# Patient Record
Sex: Male | Born: 1945 | Race: White | Hispanic: No | Marital: Married | Smoking: Never smoker
Health system: Southern US, Community
[De-identification: ages and names within clinical notes are randomized; demographics above are authoritative.]

## PROBLEM LIST (undated history)

## (undated) DIAGNOSIS — N529 Male erectile dysfunction, unspecified: Secondary | ICD-10-CM

## (undated) DIAGNOSIS — J189 Pneumonia, unspecified organism: Secondary | ICD-10-CM

## (undated) DIAGNOSIS — M779 Enthesopathy, unspecified: Secondary | ICD-10-CM

## (undated) DIAGNOSIS — M1990 Unspecified osteoarthritis, unspecified site: Secondary | ICD-10-CM

## (undated) DIAGNOSIS — T8859XA Other complications of anesthesia, initial encounter: Secondary | ICD-10-CM

## (undated) DIAGNOSIS — T4145XA Adverse effect of unspecified anesthetic, initial encounter: Secondary | ICD-10-CM

## (undated) DIAGNOSIS — J302 Other seasonal allergic rhinitis: Secondary | ICD-10-CM

## (undated) DIAGNOSIS — C801 Malignant (primary) neoplasm, unspecified: Secondary | ICD-10-CM

## (undated) DIAGNOSIS — K219 Gastro-esophageal reflux disease without esophagitis: Secondary | ICD-10-CM

## (undated) DIAGNOSIS — G4730 Sleep apnea, unspecified: Secondary | ICD-10-CM

## (undated) DIAGNOSIS — H9319 Tinnitus, unspecified ear: Secondary | ICD-10-CM

## (undated) HISTORY — PX: PROSTATE BIOPSY: SHX241

## (undated) HISTORY — PX: WISDOM TOOTH EXTRACTION: SHX21

## (undated) HISTORY — PX: KNEE ARTHROSCOPY: SHX127

## (undated) HISTORY — PX: ROTATOR CUFF REPAIR: SHX139

## (undated) HISTORY — PX: HIP SURGERY: SHX245

---

## 1972-12-05 HISTORY — PX: VASECTOMY: SHX75

## 2012-11-19 ENCOUNTER — Encounter: Payer: Self-pay | Admitting: Gastroenterology

## 2012-11-19 HISTORY — PX: COLONOSCOPY: SHX174

## 2012-11-19 HISTORY — PX: ESOPHAGOGASTRODUODENOSCOPY: SHX1529

## 2012-11-19 HISTORY — PX: UPPER GI ENDOSCOPY: SHX6162

## 2014-12-02 ENCOUNTER — Other Ambulatory Visit (HOSPITAL_COMMUNITY): Payer: Self-pay | Admitting: Urology

## 2014-12-02 DIAGNOSIS — C61 Malignant neoplasm of prostate: Secondary | ICD-10-CM

## 2014-12-18 ENCOUNTER — Ambulatory Visit (HOSPITAL_COMMUNITY)
Admission: RE | Admit: 2014-12-18 | Discharge: 2014-12-18 | Disposition: A | Discharge status: Home / Self Care | Payer: Medicare HMO | Source: Ambulatory Visit | Attending: Urology | Admitting: Urology

## 2014-12-18 DIAGNOSIS — C61 Malignant neoplasm of prostate: Secondary | ICD-10-CM | POA: Diagnosis not present

## 2014-12-18 LAB — POCT I-STAT CREATININE: Creatinine, Ser: 1.1 mg/dL (ref 0.50–1.35)

## 2014-12-18 MED ORDER — GADOBENATE DIMEGLUMINE 529 MG/ML IV SOLN
17.0000 mL | Freq: Once | INTRAVENOUS | Status: AC | PRN
  Administered 2014-12-18: 17 mL via INTRAVENOUS

## 2015-02-10 DIAGNOSIS — C61 Malignant neoplasm of prostate: Secondary | ICD-10-CM | POA: Diagnosis not present

## 2015-02-24 DIAGNOSIS — C61 Malignant neoplasm of prostate: Secondary | ICD-10-CM | POA: Diagnosis not present

## 2015-02-25 ENCOUNTER — Other Ambulatory Visit: Payer: Self-pay | Admitting: Urology

## 2015-03-03 DIAGNOSIS — R278 Other lack of coordination: Secondary | ICD-10-CM | POA: Diagnosis not present

## 2015-03-03 DIAGNOSIS — C61 Malignant neoplasm of prostate: Secondary | ICD-10-CM | POA: Diagnosis not present

## 2015-03-03 DIAGNOSIS — M6281 Muscle weakness (generalized): Secondary | ICD-10-CM | POA: Diagnosis not present

## 2015-03-06 DIAGNOSIS — Z0000 Encounter for general adult medical examination without abnormal findings: Secondary | ICD-10-CM | POA: Diagnosis not present

## 2015-03-06 DIAGNOSIS — J301 Allergic rhinitis due to pollen: Secondary | ICD-10-CM | POA: Diagnosis not present

## 2015-03-06 DIAGNOSIS — Z79899 Other long term (current) drug therapy: Secondary | ICD-10-CM | POA: Diagnosis not present

## 2015-03-06 DIAGNOSIS — C61 Malignant neoplasm of prostate: Secondary | ICD-10-CM | POA: Diagnosis not present

## 2015-03-06 DIAGNOSIS — E780 Pure hypercholesterolemia: Secondary | ICD-10-CM | POA: Diagnosis not present

## 2015-03-06 DIAGNOSIS — Z1389 Encounter for screening for other disorder: Secondary | ICD-10-CM | POA: Diagnosis not present

## 2015-03-10 DIAGNOSIS — R278 Other lack of coordination: Secondary | ICD-10-CM | POA: Diagnosis not present

## 2015-03-10 DIAGNOSIS — M6281 Muscle weakness (generalized): Secondary | ICD-10-CM | POA: Diagnosis not present

## 2015-03-10 DIAGNOSIS — C61 Malignant neoplasm of prostate: Secondary | ICD-10-CM | POA: Diagnosis not present

## 2015-03-24 DIAGNOSIS — C61 Malignant neoplasm of prostate: Secondary | ICD-10-CM | POA: Diagnosis not present

## 2015-03-24 DIAGNOSIS — M6281 Muscle weakness (generalized): Secondary | ICD-10-CM | POA: Diagnosis not present

## 2015-03-24 DIAGNOSIS — R278 Other lack of coordination: Secondary | ICD-10-CM | POA: Diagnosis not present

## 2015-04-06 NOTE — Patient Instructions (Addendum)
[place]  04/06/2015   Your procedure is scheduled on:   04-20-2015 Monday  Enter through [facility]  Entrance and follow signs to [facility]. Arrive at   [place]     AM.  Call this number if you have problems the morning of surgery: [phone]  Or Presurgical Testing [phone].   For Living Will and/or Health Care Power Attorney Forms: please provide copy for your medical record,may bring AM of surgery(Forms should be already notarized -we do not provide this service).  Remember: Follow any bowel prep instructions per MD office.    Do not eat food/ or drink: After Midnight.      Take these medicines the morning of surgery with A SIP OF WATER: nexium, singulair, eye drops and nose spray if needed   Do not wear jewelry, make-up or nail polish.  Do not wear deodorant, lotions, powders, or perfumes.   Do not shave legs and under arms- 48 hours(2 days) prior to first CHG shower.(Shaving face and neck okay.)  Do not bring valuables to the hospital.(Hospital is not responsible for lost valuables).  Contacts, dentures or removable bridgework, body piercing, hair pins may not be worn into surgery.  Leave suitcase in the car. After surgery it may be brought to your room.  For patients admitted to the hospital, checkout time is 11:00 AM the day of discharge.(Restricted visitors-Any Persons displaying flu-like symptoms or illness).        [place] - Preparing for Surgery Before surgery, you can play an important role.  Because skin is not sterile, your skin needs to be as free of germs as possible.  You can reduce the number of germs on your skin by washing with CHG (chlorahexidine gluconate) soap before surgery.  CHG is an antiseptic cleaner which kills germs and bonds with the skin to continue killing germs even after washing. Please DO NOT use if you have an allergy to CHG or antibacterial soaps.  If your skin becomes reddened/irritated stop using the CHG and inform your  nurse when you arrive at Short Stay. Do not shave (including legs and underarms) for at least 48 hours prior to the first CHG shower.  You may shave your face/neck. Please follow these instructions carefully:  1.  Shower with CHG Soap the night before surgery and the  morning of Surgery.  2.  If you choose to wash your hair, wash your hair first as usual with your  normal  shampoo.  3.  After you shampoo, rinse your hair and body thoroughly to remove the  shampoo.                            4.  Use CHG as you would any other liquid soap.  You can apply chg directly  to the skin and wash                       Gently with a scrungie or clean washcloth.  5.  Apply the CHG Soap to your body ONLY FROM THE NECK DOWN.   Do not use on face/ open                           Wound or open sores. Avoid contact with eyes, ears mouth and genitals (private parts).  Wash face,  Genitals (private parts) with your normal soap.             6.  Wash thoroughly, paying special attention to the area where your surgery  will be performed.  7.  Thoroughly rinse your body with warm water from the neck down.  8.  DO NOT shower/wash with your normal soap after using and rinsing off  the CHG Soap.                9.  Pat yourself dry with a clean towel.            10.  Wear clean pajamas.            11.  Place clean sheets on your bed the night of your first shower and do not  sleep with pets. Day of Surgery : Do not apply any lotions/deodorants the morning of surgery.  Please wear clean clothes to the hospital/surgery center.  FAILURE TO FOLLOW THESE INSTRUCTIONS MAY RESULT IN THE CANCELLATION OF YOUR SURGERY PATIENT SIGNATURE_________________________________  NURSE SIGNATURE__________________________________  ________________________________________________________________________   Benjamin Rivas  An incentive spirometer is a tool that can help keep your lungs clear and active. This  tool measures how well you are filling your lungs with each breath. Taking long deep breaths may help reverse or decrease the chance of developing breathing (pulmonary) problems (especially infection) following:  A long period of time when you are unable to move or be active. BEFORE THE PROCEDURE   If the spirometer includes an indicator to show your best effort, your nurse or respiratory therapist will set it to a desired goal.  If possible, sit up straight or lean slightly forward. Try not to slouch.  Hold the incentive spirometer in an upright position. INSTRUCTIONS FOR USE   Sit on the edge of your bed if possible, or sit up as far as you can in bed or on a chair.  Hold the incentive spirometer in an upright position.  Breathe out normally.  Place the mouthpiece in your mouth and seal your lips tightly around it.  Breathe in slowly and as deeply as possible, raising the piston or the ball toward the top of the column.  Hold your breath for 3-5 seconds or for as long as possible. Allow the piston or ball to fall to the bottom of the column.  Remove the mouthpiece from your mouth and breathe out normally.  Rest for a few seconds and repeat Steps 1 through 7 at least 10 times every 1-2 hours when you are awake. Take your time and take a few normal breaths between deep breaths.  The spirometer may include an indicator to show your best effort. Use the indicator as a goal to work toward during each repetition.  After each set of 10 deep breaths, practice coughing to be sure your lungs are clear. If you have an incision (the cut made at the time of surgery), support your incision when coughing by placing a pillow or rolled up towels firmly against it. Once you are able to get out of bed, walk around indoors and cough well. You may stop using the incentive spirometer when instructed by your caregiver.  RISKS AND COMPLICATIONS  Take your time so you do not get dizzy or  light-headed.  If you are in pain, you may need to take or ask for pain medication before doing incentive spirometry. It is harder to take a deep breath if you are having  pain. AFTER USE  Rest and breathe slowly and easily.  It can be helpful to keep track of a log of your progress. Your caregiver can provide you with a simple table to help with this. If you are using the spirometer at home, follow these instructions: [place] IF:   You are having difficultly using the spirometer.  You have trouble using the spirometer as often as instructed.  Your pain medication is not giving enough relief while using the spirometer.  You develop fever of 100.5 F (38.1 C) or higher. SEEK IMMEDIATE MEDICAL CARE IF:   You cough up bloody sputum that had not been present before.  You develop fever of 102 F (38.9 C) or greater.  You develop worsening pain at or near the incision site. MAKE SURE YOU:   Understand these instructions.  Will watch your condition.  Will get help right away if you are not doing well or get worse. Document Released: 04/03/2007 Document Revised: 02/13/2012 Document Reviewed: 06/04/2007 ExitCare Patient Information 2014 ExitCare, [place].   ________________________________________________________________________  WHAT IS A BLOOD TRANSFUSION? Blood Transfusion Information  A transfusion is the replacement of blood or some of its parts. Blood is made up of multiple cells which provide different functions.  Red blood cells carry oxygen and are used for blood loss replacement.  White blood cells fight against infection.  Platelets control bleeding.  Plasma helps clot blood.  Other blood products are available for specialized needs, such as hemophilia or other clotting disorders. BEFORE THE TRANSFUSION  Who gives blood for transfusions?   Healthy volunteers who are fully evaluated to make sure their blood is safe. This is blood bank  blood. Transfusion therapy is the safest it has ever been in the practice of medicine. Before blood is taken from a donor, a complete history is taken to make sure that person has no history of diseases nor engages in risky social behavior (examples are intravenous drug use or sexual activity with multiple partners). The donor's travel history is screened to minimize risk of transmitting infections, such as malaria. The donated blood is tested for signs of infectious diseases, such as HIV and hepatitis. The blood is then tested to be sure it is compatible with you in order to minimize the chance of a transfusion reaction. If you or a relative donates blood, this is often done in anticipation of surgery and is not appropriate for emergency situations. It takes many days to process the donated blood. RISKS AND COMPLICATIONS Although transfusion therapy is very safe and saves many lives, the main dangers of transfusion include:   Getting an infectious disease.  Developing a transfusion reaction. This is an allergic reaction to something in the blood you were given. Every precaution is taken to prevent this. The decision to have a blood transfusion has been considered carefully by your caregiver before blood is given. Blood is not given unless the benefits outweigh the risks. AFTER THE TRANSFUSION  Right after receiving a blood transfusion, you will usually feel much better and more energetic. This is especially true if your red blood cells have gotten low (anemic). The transfusion raises the level of the red blood cells which carry oxygen, and this usually causes an energy increase.  The nurse administering the transfusion will monitor you carefully for complications. HOME CARE INSTRUCTIONS  No special instructions are needed after a transfusion. You may find your energy is better. Speak with your caregiver about any limitations on activity for underlying diseases  you may have. SEEK MEDICAL CARE IF:    Your condition is not improving after your transfusion.  You develop redness or irritation at the intravenous (IV) site. SEEK IMMEDIATE MEDICAL CARE IF:  Any of the following symptoms occur over the next 12 hours:  Shaking chills.  You have a temperature by mouth above 102 F (38.9 C), not controlled by medicine.  Chest, back, or muscle pain.  People around you feel you are not acting correctly or are confused.  Shortness of breath or difficulty breathing.  Dizziness and fainting.  You get a rash or develop hives.  You have a decrease in urine output.  Your urine turns a dark color or changes to pink, red, or brown. Any of the following symptoms occur over the next 10 days:  You have a temperature by mouth above 102 F (38.9 C), not controlled by medicine.  Shortness of breath.  Weakness after normal activity.  The white part of the eye turns yellow (jaundice).  You have a decrease in the amount of urine or are urinating less often.  Your urine turns a dark color or changes to pink, red, or brown. Document Released: 11/18/2000 Document Revised: 02/13/2012 Document Reviewed: 07/07/2008 [facility] Patient Information 2014 [place], [place].  _______________________________________________________________________

## 2015-04-07 ENCOUNTER — Encounter (HOSPITAL_COMMUNITY): Payer: Self-pay

## 2015-04-07 ENCOUNTER — Other Ambulatory Visit: Payer: Self-pay

## 2015-04-07 ENCOUNTER — Encounter (HOSPITAL_COMMUNITY)
Admission: RE | Admit: 2015-04-07 | Discharge: 2015-04-07 | Disposition: A | Discharge status: Home / Self Care | Payer: Commercial Managed Care - HMO | Source: Ambulatory Visit | Attending: Urology | Admitting: Urology

## 2015-04-07 ENCOUNTER — Ambulatory Visit (HOSPITAL_COMMUNITY)
Admission: RE | Admit: 2015-04-07 | Discharge: 2015-04-07 | Disposition: A | Discharge status: Home / Self Care | Payer: Commercial Managed Care - HMO | Source: Ambulatory Visit | Attending: Anesthesiology | Admitting: Anesthesiology

## 2015-04-07 DIAGNOSIS — R0602 Shortness of breath: Secondary | ICD-10-CM | POA: Diagnosis not present

## 2015-04-07 DIAGNOSIS — C61 Malignant neoplasm of prostate: Secondary | ICD-10-CM | POA: Insufficient documentation

## 2015-04-07 DIAGNOSIS — R278 Other lack of coordination: Secondary | ICD-10-CM | POA: Diagnosis not present

## 2015-04-07 DIAGNOSIS — Z01810 Encounter for preprocedural cardiovascular examination: Secondary | ICD-10-CM | POA: Diagnosis not present

## 2015-04-07 DIAGNOSIS — M6281 Muscle weakness (generalized): Secondary | ICD-10-CM | POA: Diagnosis not present

## 2015-04-07 DIAGNOSIS — Z01811 Encounter for preprocedural respiratory examination: Secondary | ICD-10-CM | POA: Diagnosis not present

## 2015-04-07 DIAGNOSIS — C801 Malignant (primary) neoplasm, unspecified: Secondary | ICD-10-CM

## 2015-04-07 HISTORY — DX: Tinnitus, unspecified ear: H93.19

## 2015-04-07 HISTORY — DX: Enthesopathy, unspecified: M77.9

## 2015-04-07 HISTORY — DX: Other seasonal allergic rhinitis: J30.2

## 2015-04-07 HISTORY — DX: Unspecified osteoarthritis, unspecified site: M19.90

## 2015-04-07 HISTORY — DX: Malignant (primary) neoplasm, unspecified: C80.1

## 2015-04-07 HISTORY — DX: Adverse effect of unspecified anesthetic, initial encounter: T41.45XA

## 2015-04-07 HISTORY — DX: Other complications of anesthesia, initial encounter: T88.59XA

## 2015-04-07 HISTORY — DX: Gastro-esophageal reflux disease without esophagitis: K21.9

## 2015-04-07 LAB — BASIC METABOLIC PANEL
ANION GAP: 11 (ref 5–15)
BUN: 13 mg/dL (ref 6–20)
CALCIUM: 9.5 mg/dL (ref 8.9–10.3)
CHLORIDE: 103 mmol/L (ref 101–111)
CO2: 26 mmol/L (ref 22–32)
Creatinine, Ser: 1.21 mg/dL (ref 0.61–1.24)
GFR calc Af Amer: >60 mL/min (ref >60)
GFR calc non Af Amer: >60 mL/min (ref >60)
Glucose, Bld: 141 mg/dL — ABNORMAL HIGH (ref 70–99)
Potassium: 3.9 mmol/L (ref 3.5–5.1)
Sodium: 140 mmol/L (ref 135–145)

## 2015-04-07 LAB — CBC
HCT: 47.9 % (ref 39.0–52.0)
Hemoglobin: 15.3 g/dL (ref 13.0–17.0)
MCH: 30.1 pg (ref 26.0–34.0)
MCHC: 31.9 g/dL (ref 30.0–36.0)
MCV: 94.1 fL (ref 78.0–100.0)
PLATELETS: 260 10*3/uL (ref 150–400)
RBC: 5.09 MIL/uL (ref 4.22–5.81)
RDW: 13 % (ref 11.5–15.5)
WBC: 8.6 10*3/uL (ref 4.0–10.5)

## 2015-04-09 DIAGNOSIS — J0120 Acute ethmoidal sinusitis, unspecified: Secondary | ICD-10-CM | POA: Diagnosis not present

## 2015-04-17 NOTE — H&P (Signed)
  History of Present Illness [name] is a 69 year old gentleman with a fluctuating PSA who eventually underwent a prostate biopsy on 07/18/14 after his PSA increased to 5.12. 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 He has no known family history of prostate cancer. He is in generally good health overall. After initial consultation, he elected to proceed with active surveillance for management.  Initial diagnosis: August 2015 TNM stage: cT1c Nx Mx PSA: 5.12 Gleason score: 3+3=6 Biopsy (07/18/14): 2/12 cores positive -- L base (5%), R lateral apex (10%) Prostate volume: 61.1 cc PSAD: 0.08  Surveillance Jan 2016: MRI - 8 x 10 mm lesions with restricted diffusion at the right base near SV Mar 2016: 28 core biopsy (cognitive fusion, with extended sampling including transition zone) - 3/28 cores positive    L base (2/2 cores, 5% and 5%, 3+3=6), L lateral base (10%, 3+4=7), Vol 56.7 cc  Urinary function: He has minimal lower urinary tract symptoms. IPSS is 0. Erectile function: He does have erectile dysfunction and has a very low libido. He has not previously undergone treatment. He has significant difficulties obtaining an erection.     Past Medical History Problems  1. History of Arthritis 2. History of esophageal reflux (Z87.19)  Surgical History Problems  1. History of Knee Surgery Left 2. History of Rotator Cuff Repair  Current Meds 1. Aspirin 81 MG Oral Tablet;  Therapy: (Recorded:02Jan2013) to Recorded 2. Centrum Oral Tablet;  Therapy: (Recorded:26Jun2015) to Recorded 3. PriLOSEC 20 MG Oral Capsule Delayed Release;  Therapy: (Recorded:02Jan2013) to Recorded 4. Singulair 10 MG Oral Tablet;  Therapy: (Recorded:26Jun2015) to Recorded 5. Zyrtec 10 MG TABS;  Therapy: (Recorded:26Jun2015) to Recorded  Allergies Medication  1. No Known Drug Allergies  Family History Problems  1. No pertinent family history : Mother 2. Denied: Family  history of Prostate Cancer  Social History Problems  1. Denied: History of Alcohol Use 2. Marital History - Currently Married 3. Never A Smoker 4. Retired From Work  Physical Exam Constitutional: Well nourished and well developed . No acute distress.  ENT:. The ears and nose are normal in appearance.  Neck: The appearance of the neck is normal and no neck mass is present.  Pulmonary: No respiratory distress, normal respiratory rhythm and effort and clear bilateral breath sounds.  Cardiovascular: Heart rate and rhythm are normal . No peripheral edema.  Abdomen: The abdomen is soft and nontender. No masses are palpated. No CVA tenderness. No hernias are palpable. No hepatosplenomegaly noted.  Genitourinary: Examination of the penis demonstrates no discharge, no masses, no lesions and a normal meatus. The scrotum is without lesions. The right epididymis is palpably normal and non-tender. The left epididymis is palpably normal and non-tender. The right testis is non-tender and without masses. The left testis is non-tender and without masses.  Lymphatics: The femoral and inguinal nodes are not enlarged or tender.  Skin: Normal skin turgor, no visible rash and no visible skin lesions.  Neuro/Psych:. Mood and affect are appropriate.    Assessment Assessed  1. Prostate cancer (C61)    Discussion/Summary 1. Prostate cancer: He has elected surgical therapy and will proceed with a robot assisted laparoscopic radical prostatectomy and BPLND.

## 2015-04-18 MED ORDER — HYDROMORPHONE HCL 1 MG/ML IJ SOLN
INTRAMUSCULAR | Status: AC
  Filled 2015-04-18: qty 1

## 2015-04-19 NOTE — Anesthesia Preprocedure Evaluation (Addendum)
Anesthesia Evaluation  Patient identified by MRN, date of birth, ID band Patient awake    Reviewed: Allergy & Precautions, H&P , NPO status , Patient's Chart, lab work & pertinent test results  Airway Mallampati: II  TM Distance: >3 FB Neck ROM: full    Dental  (+) Caps, Dental Advisory Given Lower front are capped:   Pulmonary neg pulmonary ROS,  breath sounds clear to auscultation  Pulmonary exam normal       Cardiovascular Exercise Tolerance: Good negative cardio ROS Normal cardiovascular examRhythm:regular Rate:Normal     Neuro/Psych negative neurological ROS  negative psych ROS   GI/Hepatic negative GI ROS, Neg liver ROS, GERD-  Medicated and Controlled,  Endo/Other  negative endocrine ROS  Renal/GU negative Renal ROS  negative genitourinary   Musculoskeletal   Abdominal   Peds  Hematology negative hematology ROS (+)   Anesthesia Other Findings   Reproductive/Obstetrics negative OB ROS                            Anesthesia Physical Anesthesia Plan  ASA: II  Anesthesia Plan: General   Post-op Pain Management:    Induction: Intravenous  Airway Management Planned: Oral ETT  Additional Equipment:   Intra-op Plan:   Post-operative Plan: Extubation in OR  Informed Consent: I have reviewed the patients History and Physical, chart, labs and discussed the procedure including the risks, benefits and alternatives for the proposed anesthesia with the patient or authorized representative who has indicated his/her understanding and acceptance.   Dental Advisory Given  Plan Discussed with: CRNA and Surgeon  Anesthesia Plan Comments:         Anesthesia Quick Evaluation

## 2015-04-20 ENCOUNTER — Inpatient Hospital Stay (HOSPITAL_COMMUNITY)
Admission: RE | Admit: 2015-04-20 | Discharge: 2015-04-21 | DRG: 708 | Disposition: A | Discharge status: Home / Self Care | Payer: Commercial Managed Care - HMO | Source: Ambulatory Visit | Attending: Urology | Admitting: Urology

## 2015-04-20 ENCOUNTER — Encounter (HOSPITAL_COMMUNITY): Payer: Self-pay | Admitting: *Deleted

## 2015-04-20 ENCOUNTER — Inpatient Hospital Stay (HOSPITAL_COMMUNITY): Payer: Commercial Managed Care - HMO | Admitting: Anesthesiology

## 2015-04-20 ENCOUNTER — Encounter (HOSPITAL_COMMUNITY)
Admission: RE | Disposition: A | Discharge status: Home / Self Care | Payer: Self-pay | Source: Ambulatory Visit | Attending: Urology

## 2015-04-20 DIAGNOSIS — C61 Malignant neoplasm of prostate: Principal | ICD-10-CM | POA: Diagnosis present

## 2015-04-20 DIAGNOSIS — K219 Gastro-esophageal reflux disease without esophagitis: Secondary | ICD-10-CM | POA: Diagnosis not present

## 2015-04-20 DIAGNOSIS — Z7982 Long term (current) use of aspirin: Secondary | ICD-10-CM

## 2015-04-20 DIAGNOSIS — N529 Male erectile dysfunction, unspecified: Secondary | ICD-10-CM | POA: Diagnosis present

## 2015-04-20 DIAGNOSIS — M1990 Unspecified osteoarthritis, unspecified site: Secondary | ICD-10-CM | POA: Diagnosis present

## 2015-04-20 HISTORY — PX: LYMPHADENECTOMY: SHX5960

## 2015-04-20 HISTORY — PX: ROBOT ASSISTED LAPAROSCOPIC RADICAL PROSTATECTOMY: SHX5141

## 2015-04-20 LAB — HEMOGLOBIN AND HEMATOCRIT, BLOOD
HCT: 44.7 % (ref 39.0–52.0)
HEMOGLOBIN: 14.6 g/dL (ref 13.0–17.0)

## 2015-04-20 LAB — TYPE AND SCREEN
ABO/RH(D): A POS
ANTIBODY SCREEN: NEGATIVE

## 2015-04-20 LAB — ABO/RH: ABO/RH(D): A POS

## 2015-04-20 SURGERY — ROBOTIC ASSISTED LAPAROSCOPIC RADICAL PROSTATECTOMY LEVEL 2
Anesthesia: General

## 2015-04-20 MED ORDER — ROCURONIUM BROMIDE 100 MG/10ML IV SOLN
INTRAVENOUS | Status: AC
  Filled 2015-04-20: qty 1

## 2015-04-20 MED ORDER — FENTANYL CITRATE (PF) 250 MCG/5ML IJ SOLN
INTRAMUSCULAR | Status: AC
  Filled 2015-04-20: qty 5

## 2015-04-20 MED ORDER — FENTANYL CITRATE (PF) 100 MCG/2ML IJ SOLN
INTRAMUSCULAR | Status: DC | PRN
  Administered 2015-04-20: 75 ug via INTRAVENOUS
  Administered 2015-04-20: 25 ug via INTRAVENOUS
  Administered 2015-04-20: 50 ug via INTRAVENOUS
  Administered 2015-04-20: 100 ug via INTRAVENOUS

## 2015-04-20 MED ORDER — LORATADINE 10 MG PO TABS
10.0000 mg | ORAL_TABLET | Freq: Every day | ORAL | Status: DC
  Administered 2015-04-21: 10 mg via ORAL
  Filled 2015-04-20 (×2): qty 1

## 2015-04-20 MED ORDER — LIDOCAINE HCL (CARDIAC) 20 MG/ML IV SOLN
INTRAVENOUS | Status: AC
  Filled 2015-04-20: qty 5

## 2015-04-20 MED ORDER — HEPARIN SODIUM (PORCINE) 1000 UNIT/ML IJ SOLN
INTRAMUSCULAR | Status: AC
  Filled 2015-04-20: qty 1

## 2015-04-20 MED ORDER — MORPHINE SULFATE 2 MG/ML IJ SOLN: 2.0000 mg | INTRAMUSCULAR | Status: DC | PRN

## 2015-04-20 MED ORDER — GLYCOPYRROLATE 0.2 MG/ML IJ SOLN
INTRAMUSCULAR | Status: AC
  Filled 2015-04-20: qty 3

## 2015-04-20 MED ORDER — KCL IN DEXTROSE-NACL 20-5-0.45 MEQ/L-%-% IV SOLN
INTRAVENOUS | Status: AC
  Filled 2015-04-20: qty 1000

## 2015-04-20 MED ORDER — BUPIVACAINE-EPINEPHRINE 0.25% -1:200000 IJ SOLN
INTRAMUSCULAR | Status: DC | PRN
  Administered 2015-04-20: 30 mL

## 2015-04-20 MED ORDER — ACETAMINOPHEN 325 MG PO TABS: 650.0000 mg | ORAL_TABLET | ORAL | Status: DC | PRN

## 2015-04-20 MED ORDER — DIPHENHYDRAMINE HCL 12.5 MG/5ML PO ELIX: 12.5000 mg | ORAL_SOLUTION | Freq: Four times a day (QID) | ORAL | Status: DC | PRN

## 2015-04-20 MED ORDER — DIPHENHYDRAMINE HCL 50 MG/ML IJ SOLN: 12.5000 mg | Freq: Four times a day (QID) | INTRAMUSCULAR | Status: DC | PRN

## 2015-04-20 MED ORDER — ONDANSETRON HCL 4 MG/2ML IJ SOLN
INTRAMUSCULAR | Status: AC
  Filled 2015-04-20: qty 2

## 2015-04-20 MED ORDER — HYDROMORPHONE HCL 1 MG/ML IJ SOLN
0.2500 mg | INTRAMUSCULAR | Status: DC | PRN
  Administered 2015-04-20 (×2): 0.5 mg via INTRAVENOUS

## 2015-04-20 MED ORDER — CEFAZOLIN SODIUM 1-5 GM-% IV SOLN
1.0000 g | Freq: Three times a day (TID) | INTRAVENOUS | Status: AC
  Administered 2015-04-20 (×2): 1 g via INTRAVENOUS
  Filled 2015-04-20 (×2): qty 50

## 2015-04-20 MED ORDER — PHENYLEPHRINE HCL 10 MG/ML IJ SOLN
INTRAMUSCULAR | Status: DC | PRN
  Administered 2015-04-20 (×2): 80 ug via INTRAVENOUS
  Administered 2015-04-20 (×2): 40 ug via INTRAVENOUS
  Administered 2015-04-20 (×3): 80 ug via INTRAVENOUS

## 2015-04-20 MED ORDER — GLYCOPYRROLATE 0.2 MG/ML IJ SOLN: INTRAMUSCULAR | Status: DC | PRN

## 2015-04-20 MED ORDER — EPHEDRINE SULFATE 50 MG/ML IJ SOLN
INTRAMUSCULAR | Status: DC | PRN
  Administered 2015-04-20: 10 mg via INTRAVENOUS

## 2015-04-20 MED ORDER — HYDROMORPHONE HCL 1 MG/ML IJ SOLN
INTRAMUSCULAR | Status: AC
  Filled 2015-04-20: qty 1

## 2015-04-20 MED ORDER — PHENYLEPHRINE 40 MCG/ML (10ML) SYRINGE FOR IV PUSH (FOR BLOOD PRESSURE SUPPORT)
PREFILLED_SYRINGE | INTRAVENOUS | Status: AC
  Filled 2015-04-20: qty 10

## 2015-04-20 MED ORDER — PHENYLEPHRINE 40 MCG/ML (10ML) SYRINGE FOR IV PUSH (FOR BLOOD PRESSURE SUPPORT)
PREFILLED_SYRINGE | INTRAVENOUS | Status: AC
  Filled 2015-04-20: qty 20

## 2015-04-20 MED ORDER — PANTOPRAZOLE SODIUM 40 MG PO TBEC
40.0000 mg | DELAYED_RELEASE_TABLET | Freq: Every day | ORAL | Status: DC
  Administered 2015-04-20 – 2015-04-21 (×2): 40 mg via ORAL
  Filled 2015-04-20 (×2): qty 1

## 2015-04-20 MED ORDER — CIPROFLOXACIN HCL 500 MG PO TABS: 500.0000 mg | ORAL_TABLET | Freq: Two times a day (BID) | ORAL | Status: DC

## 2015-04-20 MED ORDER — NEOSTIGMINE METHYLSULFATE 10 MG/10ML IV SOLN
INTRAVENOUS | Status: AC
  Filled 2015-04-20: qty 1

## 2015-04-20 MED ORDER — MIDAZOLAM HCL 5 MG/5ML IJ SOLN
INTRAMUSCULAR | Status: DC | PRN
  Administered 2015-04-20 (×2): 1 mg via INTRAVENOUS

## 2015-04-20 MED ORDER — PROPOFOL 10 MG/ML IV BOLUS
INTRAVENOUS | Status: AC
  Filled 2015-04-20: qty 20

## 2015-04-20 MED ORDER — KCL IN DEXTROSE-NACL 20-5-0.45 MEQ/L-%-% IV SOLN
INTRAVENOUS | Status: DC
  Administered 2015-04-20: 1000 mL via INTRAVENOUS
  Administered 2015-04-20 – 2015-04-21 (×2): via INTRAVENOUS
  Filled 2015-04-20 (×4): qty 1000

## 2015-04-20 MED ORDER — FLUTICASONE PROPIONATE 50 MCG/ACT NA SUSP
1.0000 | Freq: Every day | NASAL | Status: DC
  Administered 2015-04-21: 1 via NASAL
  Filled 2015-04-20: qty 16

## 2015-04-20 MED ORDER — ONDANSETRON HCL 4 MG/2ML IJ SOLN
4.0000 mg | INTRAMUSCULAR | Status: DC | PRN
  Administered 2015-04-20: 4 mg via INTRAVENOUS
  Filled 2015-04-20: qty 2

## 2015-04-20 MED ORDER — SODIUM CHLORIDE 0.9 % IR SOLN
Status: DC | PRN
  Administered 2015-04-20: 300 mL via INTRAVESICAL
  Administered 2015-04-20: 200 mL

## 2015-04-20 MED ORDER — LACTATED RINGERS IV SOLN
INTRAVENOUS | Status: DC | PRN
  Administered 2015-04-20 (×2): via INTRAVENOUS

## 2015-04-20 MED ORDER — PROPOFOL 10 MG/ML IV BOLUS
INTRAVENOUS | Status: DC | PRN
Start: 2015-04-20 — End: 2015-04-20
  Administered 2015-04-20: 20 mg via INTRAVENOUS
  Administered 2015-04-20: 180 mg via INTRAVENOUS

## 2015-04-20 MED ORDER — MIDAZOLAM HCL 2 MG/2ML IJ SOLN
INTRAMUSCULAR | Status: AC
  Filled 2015-04-20: qty 2

## 2015-04-20 MED ORDER — EPHEDRINE SULFATE 50 MG/ML IJ SOLN
INTRAMUSCULAR | Status: AC
  Filled 2015-04-20: qty 1

## 2015-04-20 MED ORDER — KETOROLAC TROMETHAMINE 15 MG/ML IJ SOLN
15.0000 mg | Freq: Four times a day (QID) | INTRAMUSCULAR | Status: DC
  Administered 2015-04-20 – 2015-04-21 (×5): 15 mg via INTRAVENOUS
  Filled 2015-04-20 (×5): qty 1

## 2015-04-20 MED ORDER — SUCCINYLCHOLINE CHLORIDE 20 MG/ML IJ SOLN
INTRAMUSCULAR | Status: DC | PRN
  Administered 2015-04-20: 100 mg via INTRAVENOUS

## 2015-04-20 MED ORDER — SODIUM CHLORIDE 0.9 % IJ SOLN
INTRAMUSCULAR | Status: AC
  Filled 2015-04-20: qty 10

## 2015-04-20 MED ORDER — LIDOCAINE HCL (CARDIAC) 20 MG/ML IV SOLN
INTRAVENOUS | Status: DC | PRN
  Administered 2015-04-20: 80 mg via INTRAVENOUS

## 2015-04-20 MED ORDER — STERILE WATER FOR IRRIGATION IR SOLN
Status: DC | PRN
  Administered 2015-04-20: 1500 mL

## 2015-04-20 MED ORDER — ONDANSETRON HCL 4 MG/2ML IJ SOLN
INTRAMUSCULAR | Status: DC | PRN
  Administered 2015-04-20: 4 mg via INTRAVENOUS

## 2015-04-20 MED ORDER — LACTATED RINGERS IV SOLN: INTRAVENOUS | Status: DC

## 2015-04-20 MED ORDER — SODIUM CHLORIDE 0.9 % IV BOLUS (SEPSIS)
1000.0000 mL | Freq: Once | INTRAVENOUS | Status: AC
  Administered 2015-04-20: 1000 mL via INTRAVENOUS

## 2015-04-20 MED ORDER — GLYCOPYRROLATE 0.2 MG/ML IJ SOLN
INTRAMUSCULAR | Status: DC | PRN
  Administered 2015-04-20: .6 mg via INTRAVENOUS

## 2015-04-20 MED ORDER — DOCUSATE SODIUM 100 MG PO CAPS
100.0000 mg | ORAL_CAPSULE | Freq: Two times a day (BID) | ORAL | Status: DC
  Administered 2015-04-20 – 2015-04-21 (×3): 100 mg via ORAL
  Filled 2015-04-20 (×4): qty 1

## 2015-04-20 MED ORDER — CEFAZOLIN SODIUM-DEXTROSE 2-3 GM-% IV SOLR
INTRAVENOUS | Status: AC
  Filled 2015-04-20: qty 50

## 2015-04-20 MED ORDER — CEFAZOLIN SODIUM-DEXTROSE 2-3 GM-% IV SOLR
2.0000 g | INTRAVENOUS | Status: AC
  Administered 2015-04-20: 2 g via INTRAVENOUS

## 2015-04-20 MED ORDER — ROCURONIUM BROMIDE 100 MG/10ML IV SOLN
INTRAVENOUS | Status: DC | PRN
  Administered 2015-04-20: 30 mg via INTRAVENOUS
  Administered 2015-04-20: 40 mg via INTRAVENOUS

## 2015-04-20 MED ORDER — MONTELUKAST SODIUM 10 MG PO TABS
10.0000 mg | ORAL_TABLET | Freq: Every morning | ORAL | Status: DC
  Administered 2015-04-20 – 2015-04-21 (×2): 10 mg via ORAL
  Filled 2015-04-20 (×2): qty 1

## 2015-04-20 MED ORDER — NEOSTIGMINE METHYLSULFATE 10 MG/10ML IV SOLN
INTRAVENOUS | Status: DC | PRN
  Administered 2015-04-20: 4 mg via INTRAVENOUS

## 2015-04-20 MED ORDER — HYDROCODONE-ACETAMINOPHEN 5-325 MG PO TABS: 1.0000 | ORAL_TABLET | Freq: Four times a day (QID) | ORAL | Status: DC | PRN

## 2015-04-20 MED ORDER — BUPIVACAINE-EPINEPHRINE (PF) 0.25% -1:200000 IJ SOLN
INTRAMUSCULAR | Status: AC
  Filled 2015-04-20: qty 30

## 2015-04-20 SURGICAL SUPPLY — 51 items
CABLE HIGH FREQUENCY MONO STRZ (ELECTRODE) ×4 IMPLANT
CATH FOLEY 2WAY SLVR 18FR 30CC (CATHETERS) ×4 IMPLANT
CATH ROBINSON RED A/P 16FR (CATHETERS) ×4 IMPLANT
CATH ROBINSON RED A/P 8FR (CATHETERS) ×4 IMPLANT
CATH TIEMANN FOLEY 18FR 5CC (CATHETERS) ×4 IMPLANT
CHLORAPREP W/TINT 26ML (MISCELLANEOUS) ×4 IMPLANT
CLIP LIGATING HEM O LOK PURPLE (MISCELLANEOUS) ×8 IMPLANT
CLOTH BEACON ORANGE TIMEOUT ST (SAFETY) ×4 IMPLANT
COVER SURGICAL LIGHT HANDLE (MISCELLANEOUS) ×4 IMPLANT
COVER TIP SHEARS 8 DVNC (MISCELLANEOUS) ×2 IMPLANT
COVER TIP SHEARS 8MM DA VINCI (MISCELLANEOUS) ×2
CUTTER ECHEON FLEX ENDO 45 340 (ENDOMECHANICALS) ×4 IMPLANT
DECANTER SPIKE VIAL GLASS SM (MISCELLANEOUS) ×4 IMPLANT
DRAPE SURG IRRIG POUCH 19X23 (DRAPES) ×4 IMPLANT
DRSG TEGADERM 4X4.75 (GAUZE/BANDAGES/DRESSINGS) ×4 IMPLANT
DRSG TEGADERM 6X8 (GAUZE/BANDAGES/DRESSINGS) ×8 IMPLANT
ELECT REM PT RETURN 9FT ADLT (ELECTROSURGICAL) ×4
ELECTRODE REM PT RTRN 9FT ADLT (ELECTROSURGICAL) ×2 IMPLANT
GLOVE BIO SURGEON STRL SZ 6.5 (GLOVE) ×3 IMPLANT
GLOVE BIO SURGEONS STRL SZ 6.5 (GLOVE) ×1
GLOVE BIOGEL M STRL SZ7.5 (GLOVE) ×8 IMPLANT
GOWN STRL REUS W/TWL LRG LVL3 (GOWN DISPOSABLE) ×12 IMPLANT
HOLDER FOLEY CATH W/STRAP (MISCELLANEOUS) ×4 IMPLANT
IV LACTATED RINGERS 1000ML (IV SOLUTION) ×4 IMPLANT
KIT ACCESSORY DA VINCI DISP (KITS) ×2
KIT ACCESSORY DVNC DISP (KITS) ×2 IMPLANT
LIQUID BAND (GAUZE/BANDAGES/DRESSINGS) ×4 IMPLANT
MANIFOLD NEPTUNE II (INSTRUMENTS) ×4 IMPLANT
NDL SAFETY ECLIPSE 18X1.5 (NEEDLE) ×2 IMPLANT
NEEDLE HYPO 18GX1.5 SHARP (NEEDLE) ×2
PACK ROBOT UROLOGY CUSTOM (CUSTOM PROCEDURE TRAY) ×4 IMPLANT
RELOAD GREEN ECHELON 45 (STAPLE) ×4 IMPLANT
SET TUBE IRRIG SUCTION NO TIP (IRRIGATION / IRRIGATOR) ×4 IMPLANT
SHEET LAVH (DRAPES) IMPLANT
SLEEVE SURGEON STRL (DRAPES) ×4 IMPLANT
SOLUTION ELECTROLUBE (MISCELLANEOUS) ×4 IMPLANT
SUT ETHILON 3 0 PS 1 (SUTURE) ×4 IMPLANT
SUT MNCRL 3 0 RB1 (SUTURE) ×2 IMPLANT
SUT MNCRL 3 0 VIOLET RB1 (SUTURE) ×4 IMPLANT
SUT MNCRL AB 4-0 PS2 18 (SUTURE) ×8 IMPLANT
SUT MONOCRYL 3 0 RB1 (SUTURE) ×6
SUT VIC AB 0 CT1 27 (SUTURE) ×2
SUT VIC AB 0 CT1 27XBRD ANTBC (SUTURE) ×2 IMPLANT
SUT VIC AB 0 UR5 27 (SUTURE) ×4 IMPLANT
SUT VIC AB 2-0 SH 27 (SUTURE) ×4
SUT VIC AB 2-0 SH 27X BRD (SUTURE) ×4 IMPLANT
SUT VICRYL 0 UR6 27IN ABS (SUTURE) ×8 IMPLANT
SYR 27GX1/2 1ML LL SAFETY (SYRINGE) ×4 IMPLANT
TOWEL OR 17X26 10 PK STRL BLUE (TOWEL DISPOSABLE) ×4 IMPLANT
TOWEL OR NON WOVEN STRL DISP B (DISPOSABLE) ×4 IMPLANT
WATER STERILE IRR 1500ML POUR (IV SOLUTION) ×8 IMPLANT

## 2015-04-20 NOTE — Anesthesia Procedure Notes (Signed)
Procedure Name: Intubation Date/Time: 04/20/2015 7:31 AM Performed by: Dimas Millin, Iniya Matzek F Pre-anesthesia Checklist: Patient identified, Emergency Drugs available, Suction available, Patient being monitored and Timeout performed Patient Re-evaluated:Patient Re-evaluated prior to inductionOxygen Delivery Method: Circle system utilized Preoxygenation: Pre-oxygenation with 100% oxygen Intubation Type: IV induction Ventilation: Mask ventilation without difficulty Laryngoscope Size: Miller and 3 Grade View: Grade II Tube type: Oral Tube size: 7.5 mm Number of attempts: 2 Airway Equipment and Method: Stylet and Bite block Placement Confirmation: ETT inserted through vocal cords under direct vision,  positive ETCO2 and breath sounds checked- equal and bilateral Secured at: 24 cm Tube secured with: Tape Dental Injury: Teeth and Oropharynx as per pre-operative assessment

## 2015-04-20 NOTE — Op Note (Signed)

## 2015-04-20 NOTE — Discharge Instructions (Signed)

## 2015-04-20 NOTE — Transfer of Care (Signed)
Immediate Anesthesia Transfer of Care Note  Patient: Benjamin Rivas  Procedure(s) Performed: Procedure(s): ROBOTIC ASSISTED LAPAROSCOPIC RADICAL PROSTATECTOMY LEVEL 2 (N/A) PELVIC LYMPHADENECTOMY (Bilateral)  Patient Location: PACU  Anesthesia Type:General  Level of Consciousness: awake, alert  and oriented  Airway & Oxygen Therapy: Patient Spontanous Breathing and Patient connected to face mask oxygen  Post-op Assessment: Report given to RN and Post -op Vital signs reviewed and stable  Post vital signs: Reviewed and stable  Last Vitals:  Filed Vitals:   04/20/15 0528  BP: 146/78  Pulse: 78  Temp: 36.6 C  Resp: 18    Complications: No apparent anesthesia complications

## 2015-04-20 NOTE — Progress Notes (Signed)
Post-op note  Subjective: The patient is doing well.  No complaints.  Denies N/V  Objective: Vital signs in last 24 hours: Temp:  [97.4 F (36.3 C)-97.8 F (36.6 C)] 97.6 F (36.4 C) (05/16 1200) Pulse Rate:  [78-97] 86 (05/16 1200) Resp:  [16-22] 16 (05/16 1200) BP: (123-146)/(64-78) 138/68 mmHg (05/16 1200) SpO2:  [96 %-100 %] 96 % (05/16 1200) Weight:  [84.823 kg (187 lb)] 84.823 kg (187 lb) (05/16 0528)  Intake/Output from previous day:   Intake/Output this shift: Total I/O In: 2100 [I.V.:1100; IV Piggyback:1000] Out: 240 [Urine:130; Drains:10; Blood:100]  Physical Exam:  General: Alert and oriented. Abdomen: Soft, Nondistended. Incisions: Clean and dry. Urine: red  Lab Results:  Recent Labs  04/20/15 1100  HGB 14.6  HCT 44.7    Assessment/Plan: POD#0   1) Continue to monitor  2) DVT prophy, clears, IS, amb, pain control   LOS: 0 days   Benjamin Rivas 04/20/2015, 3:02 PM

## 2015-04-20 NOTE — Anesthesia Postprocedure Evaluation (Signed)
  Anesthesia Post-op Note  Patient: Benjamin Rivas  Procedure(s) Performed: Procedure(s) (LRB): ROBOTIC ASSISTED LAPAROSCOPIC RADICAL PROSTATECTOMY LEVEL 2 (N/A) PELVIC LYMPHADENECTOMY (Bilateral)  Patient Location: PACU  Anesthesia Type: General  Level of Consciousness: awake and alert   Airway and Oxygen Therapy: Patient Spontanous Breathing  Post-op Pain: mild  Post-op Assessment: Post-op Vital signs reviewed, Patient's Cardiovascular Status Stable, Respiratory Function Stable, Patent Airway and No signs of Nausea or vomiting  Last Vitals:  Filed Vitals:   04/20/15 1115  BP: 137/70  Pulse: 87  Temp: 36.3 C  Resp: 20    Post-op Vital Signs: stable   Complications: No apparent anesthesia complications

## 2015-04-21 ENCOUNTER — Encounter (HOSPITAL_COMMUNITY): Payer: Self-pay | Admitting: Urology

## 2015-04-21 LAB — HEMOGLOBIN AND HEMATOCRIT, BLOOD
HCT: 36.7 % — ABNORMAL LOW (ref 39.0–52.0)
Hemoglobin: 12.3 g/dL — ABNORMAL LOW (ref 13.0–17.0)

## 2015-04-21 MED ORDER — ZOLPIDEM TARTRATE 5 MG PO TABS
5.0000 mg | ORAL_TABLET | Freq: Every evening | ORAL | Status: DC | PRN
  Administered 2015-04-21: 5 mg via ORAL
  Filled 2015-04-21: qty 1

## 2015-04-21 MED ORDER — BISACODYL 10 MG RE SUPP
10.0000 mg | Freq: Once | RECTAL | Status: AC
  Administered 2015-04-21: 10 mg via RECTAL
  Filled 2015-04-21 (×2): qty 1

## 2015-04-21 MED ORDER — HYDROCODONE-ACETAMINOPHEN 5-325 MG PO TABS: 1.0000 | ORAL_TABLET | Freq: Four times a day (QID) | ORAL | Status: DC | PRN

## 2015-04-21 NOTE — Discharge Summary (Signed)
  Date of admission: 04/20/2015  Date of discharge: 04/21/2015  Admission diagnosis: Prostate Cancer  Discharge diagnosis: Prostate Cancer  History and Physical: For full details, please see admission history and physical. Briefly, Benjamin Rivas is a 69 y.o. gentleman with localized prostate cancer.  After discussing management/treatment options, he elected to proceed with surgical treatment.  Hospital Course: Benjamin Rivas was taken to the operating room on 04/20/2015 and underwent a robotic assisted laparoscopic radical prostatectomy. He tolerated this procedure well and without complications. Postoperatively, he was able to be transferred to a regular hospital room following recovery from anesthesia.  He was able to begin ambulating the night of surgery. He remained hemodynamically stable overnight.  He had excellent urine output with appropriately minimal output from his pelvic drain and his pelvic drain was removed on POD #1.  He was transitioned to oral pain medication, tolerated a clear liquid diet, and had met all discharge criteria and was able to be discharged home later on POD#1.  Laboratory values:  Recent Labs  04/20/15 1100 04/21/15 0509  HGB 14.6 12.3*  HCT 44.7 36.7*    Disposition: Home  Discharge instruction: He was instructed to be ambulatory but to refrain from heavy lifting, strenuous activity, or driving. He was instructed on urethral catheter care.  Discharge medications:     Medication List    STOP taking these medications        ibuprofen 200 MG tablet  Commonly known as:  ADVIL,MOTRIN      TAKE these medications        ALLERGY EYE OP  Apply 1-2 drops to eye daily as needed (allergies.).     azithromycin 500 MG tablet  Commonly known as:  ZITHROMAX  Take by mouth daily.     cetirizine 10 MG tablet  Commonly known as:  ZYRTEC  Take 10 mg by mouth every evening.     ciprofloxacin 500 MG tablet  Commonly known as:  CIPRO  Take 1 tablet (500 mg total)  by mouth 2 (two) times daily. Start day prior to office visit for foley removal     esomeprazole 20 MG capsule  Commonly known as:  NEXIUM  Take 20 mg by mouth every morning.     HYDROcodone-acetaminophen 5-325 MG per tablet  Commonly known as:  NORCO  Take 1-2 tablets by mouth every 6 (six) hours as needed.     montelukast 10 MG tablet  Commonly known as:  SINGULAIR  Take 10 mg by mouth every morning.     NASACORT AQ NA  Place 1 spray into the nose 2 (two) times daily.     predniSONE 20 MG tablet  Commonly known as:  DELTASONE  Take 40 mg by mouth daily with breakfast.        Followup: He will followup in 1 week for catheter removal and to discuss his surgical pathology results.

## 2015-04-21 NOTE — Progress Notes (Signed)
Patient ID: Benjamin Rivas, male   DOB: [birth year], 69 y.o.   MRN: [id number]  1 Day Post-Op Subjective: The patient is doing well.  No nausea or vomiting. Pain is adequately controlled.  Objective: Vital signs in last 24 hours: Temp:  [97.4 F (36.3 C)-99.6 F (37.6 C)] 98.4 F (36.9 C) (05/17 0814) Pulse Rate:  [84-97] 85 (05/17 0633) Resp:  [16-22] 18 (05/17 0633) BP: (123-148)/(64-74) 146/71 mmHg (05/17 0633) SpO2:  [95 %-100 %] 95 % (05/17 4818)  Intake/Output from previous day: 05/16 0701 - 05/17 0700 In: 5092.5 [P.O.:120; I.V.:3872.5; IV Piggyback:1100] Out: 2355 [Urine:2205; Drains:50; Blood:100] Intake/Output this shift:    Physical Exam:  General: Alert and oriented. CV: RRR Lungs: Clear bilaterally. GI: Soft, Nondistended. Incisions: Clean, dry, and intact Urine: Clear Extremities: Nontender, no erythema, no edema.  Lab Results:  Recent Labs  04/20/15 1100 04/21/15 0509  HGB 14.6 12.3*  HCT 44.7 36.7*      Assessment/Plan: POD# 1 s/p robotic prostatectomy.  1) SL IVF 2) Ambulate, Incentive spirometry 3) Transition to oral pain medication 4) Dulcolax suppository 5) D/C pelvic drain 6) Plan for likely discharge later today   Pryor Curia. MD   LOS: 1 day   Jameelah Watts,LES 04/21/2015, 7:53 AM

## 2015-05-21 DIAGNOSIS — R278 Other lack of coordination: Secondary | ICD-10-CM | POA: Diagnosis not present

## 2015-05-21 DIAGNOSIS — N393 Stress incontinence (female) (male): Secondary | ICD-10-CM | POA: Diagnosis not present

## 2015-05-21 DIAGNOSIS — M6281 Muscle weakness (generalized): Secondary | ICD-10-CM | POA: Diagnosis not present

## 2015-06-03 DIAGNOSIS — N393 Stress incontinence (female) (male): Secondary | ICD-10-CM | POA: Diagnosis not present

## 2015-06-03 DIAGNOSIS — M6281 Muscle weakness (generalized): Secondary | ICD-10-CM | POA: Diagnosis not present

## 2015-06-03 DIAGNOSIS — R278 Other lack of coordination: Secondary | ICD-10-CM | POA: Diagnosis not present

## 2015-06-12 DIAGNOSIS — C61 Malignant neoplasm of prostate: Secondary | ICD-10-CM | POA: Diagnosis not present

## 2015-06-18 DIAGNOSIS — C61 Malignant neoplasm of prostate: Secondary | ICD-10-CM | POA: Diagnosis not present

## 2015-08-21 DIAGNOSIS — H5213 Myopia, bilateral: Secondary | ICD-10-CM | POA: Diagnosis not present

## 2015-08-21 DIAGNOSIS — H5210 Myopia, unspecified eye: Secondary | ICD-10-CM | POA: Diagnosis not present

## 2015-09-01 DIAGNOSIS — Z0100 Encounter for examination of eyes and vision without abnormal findings: Secondary | ICD-10-CM | POA: Diagnosis not present

## 2015-09-14 DIAGNOSIS — Z23 Encounter for immunization: Secondary | ICD-10-CM | POA: Diagnosis not present

## 2015-11-13 DIAGNOSIS — G8929 Other chronic pain: Secondary | ICD-10-CM | POA: Diagnosis not present

## 2015-11-13 DIAGNOSIS — M25562 Pain in left knee: Secondary | ICD-10-CM | POA: Diagnosis not present

## 2015-11-13 DIAGNOSIS — M25561 Pain in right knee: Secondary | ICD-10-CM | POA: Diagnosis not present

## 2015-12-10 DIAGNOSIS — C61 Malignant neoplasm of prostate: Secondary | ICD-10-CM | POA: Diagnosis not present

## 2015-12-16 DIAGNOSIS — C61 Malignant neoplasm of prostate: Secondary | ICD-10-CM | POA: Diagnosis not present

## 2015-12-16 DIAGNOSIS — Z0000 Encounter for general adult medical examination without abnormal findings: Secondary | ICD-10-CM | POA: Diagnosis not present

## 2015-12-16 DIAGNOSIS — N5201 Erectile dysfunction due to arterial insufficiency: Secondary | ICD-10-CM | POA: Diagnosis not present

## 2015-12-22 DIAGNOSIS — J0120 Acute ethmoidal sinusitis, unspecified: Secondary | ICD-10-CM | POA: Diagnosis not present

## 2015-12-22 DIAGNOSIS — G4710 Hypersomnia, unspecified: Secondary | ICD-10-CM | POA: Diagnosis not present

## 2015-12-22 DIAGNOSIS — K219 Gastro-esophageal reflux disease without esophagitis: Secondary | ICD-10-CM | POA: Diagnosis not present

## 2015-12-22 DIAGNOSIS — G4730 Sleep apnea, unspecified: Secondary | ICD-10-CM | POA: Diagnosis not present

## 2016-01-22 DIAGNOSIS — G4733 Obstructive sleep apnea (adult) (pediatric): Secondary | ICD-10-CM | POA: Diagnosis not present

## 2016-02-08 DIAGNOSIS — J301 Allergic rhinitis due to pollen: Secondary | ICD-10-CM | POA: Diagnosis not present

## 2016-02-08 DIAGNOSIS — E7800 Pure hypercholesterolemia, unspecified: Secondary | ICD-10-CM | POA: Diagnosis not present

## 2016-02-08 DIAGNOSIS — M179 Osteoarthritis of knee, unspecified: Secondary | ICD-10-CM | POA: Diagnosis not present

## 2016-02-08 DIAGNOSIS — G4733 Obstructive sleep apnea (adult) (pediatric): Secondary | ICD-10-CM | POA: Diagnosis not present

## 2016-02-22 DIAGNOSIS — G4733 Obstructive sleep apnea (adult) (pediatric): Secondary | ICD-10-CM | POA: Diagnosis not present

## 2016-03-10 DIAGNOSIS — J301 Allergic rhinitis due to pollen: Secondary | ICD-10-CM | POA: Diagnosis not present

## 2016-03-10 DIAGNOSIS — G4733 Obstructive sleep apnea (adult) (pediatric): Secondary | ICD-10-CM | POA: Diagnosis not present

## 2016-03-10 DIAGNOSIS — M179 Osteoarthritis of knee, unspecified: Secondary | ICD-10-CM | POA: Diagnosis not present

## 2016-03-10 DIAGNOSIS — E7800 Pure hypercholesterolemia, unspecified: Secondary | ICD-10-CM | POA: Diagnosis not present

## 2016-04-04 DIAGNOSIS — Z1321 Encounter for screening for nutritional disorder: Secondary | ICD-10-CM | POA: Diagnosis not present

## 2016-04-04 DIAGNOSIS — E785 Hyperlipidemia, unspecified: Secondary | ICD-10-CM | POA: Diagnosis not present

## 2016-04-04 DIAGNOSIS — J301 Allergic rhinitis due to pollen: Secondary | ICD-10-CM | POA: Diagnosis not present

## 2016-04-04 DIAGNOSIS — Z0000 Encounter for general adult medical examination without abnormal findings: Secondary | ICD-10-CM | POA: Diagnosis not present

## 2016-04-04 DIAGNOSIS — Z79899 Other long term (current) drug therapy: Secondary | ICD-10-CM | POA: Diagnosis not present

## 2016-04-04 DIAGNOSIS — G4733 Obstructive sleep apnea (adult) (pediatric): Secondary | ICD-10-CM | POA: Diagnosis not present

## 2016-04-09 DIAGNOSIS — M179 Osteoarthritis of knee, unspecified: Secondary | ICD-10-CM | POA: Diagnosis not present

## 2016-04-09 DIAGNOSIS — J301 Allergic rhinitis due to pollen: Secondary | ICD-10-CM | POA: Diagnosis not present

## 2016-04-09 DIAGNOSIS — E7800 Pure hypercholesterolemia, unspecified: Secondary | ICD-10-CM | POA: Diagnosis not present

## 2016-04-09 DIAGNOSIS — G4733 Obstructive sleep apnea (adult) (pediatric): Secondary | ICD-10-CM | POA: Diagnosis not present

## 2016-04-14 DIAGNOSIS — M1612 Unilateral primary osteoarthritis, left hip: Secondary | ICD-10-CM | POA: Diagnosis not present

## 2016-04-14 DIAGNOSIS — M170 Bilateral primary osteoarthritis of knee: Secondary | ICD-10-CM | POA: Diagnosis not present

## 2016-05-10 DIAGNOSIS — E7800 Pure hypercholesterolemia, unspecified: Secondary | ICD-10-CM | POA: Diagnosis not present

## 2016-05-10 DIAGNOSIS — J301 Allergic rhinitis due to pollen: Secondary | ICD-10-CM | POA: Diagnosis not present

## 2016-05-10 DIAGNOSIS — M179 Osteoarthritis of knee, unspecified: Secondary | ICD-10-CM | POA: Diagnosis not present

## 2016-05-10 DIAGNOSIS — G4733 Obstructive sleep apnea (adult) (pediatric): Secondary | ICD-10-CM | POA: Diagnosis not present

## 2016-05-11 DIAGNOSIS — G4733 Obstructive sleep apnea (adult) (pediatric): Secondary | ICD-10-CM | POA: Diagnosis not present

## 2016-05-12 DIAGNOSIS — M1612 Unilateral primary osteoarthritis, left hip: Secondary | ICD-10-CM | POA: Diagnosis not present

## 2016-06-09 DIAGNOSIS — G4733 Obstructive sleep apnea (adult) (pediatric): Secondary | ICD-10-CM | POA: Diagnosis not present

## 2016-06-09 DIAGNOSIS — J301 Allergic rhinitis due to pollen: Secondary | ICD-10-CM | POA: Diagnosis not present

## 2016-06-09 DIAGNOSIS — M179 Osteoarthritis of knee, unspecified: Secondary | ICD-10-CM | POA: Diagnosis not present

## 2016-06-09 DIAGNOSIS — E7800 Pure hypercholesterolemia, unspecified: Secondary | ICD-10-CM | POA: Diagnosis not present

## 2016-07-01 DIAGNOSIS — Z8546 Personal history of malignant neoplasm of prostate: Secondary | ICD-10-CM | POA: Diagnosis not present

## 2016-07-01 DIAGNOSIS — N5201 Erectile dysfunction due to arterial insufficiency: Secondary | ICD-10-CM | POA: Diagnosis not present

## 2016-07-10 DIAGNOSIS — M179 Osteoarthritis of knee, unspecified: Secondary | ICD-10-CM | POA: Diagnosis not present

## 2016-07-10 DIAGNOSIS — J301 Allergic rhinitis due to pollen: Secondary | ICD-10-CM | POA: Diagnosis not present

## 2016-07-10 DIAGNOSIS — E7800 Pure hypercholesterolemia, unspecified: Secondary | ICD-10-CM | POA: Diagnosis not present

## 2016-07-10 DIAGNOSIS — G4733 Obstructive sleep apnea (adult) (pediatric): Secondary | ICD-10-CM | POA: Diagnosis not present

## 2016-08-10 DIAGNOSIS — J301 Allergic rhinitis due to pollen: Secondary | ICD-10-CM | POA: Diagnosis not present

## 2016-08-10 DIAGNOSIS — M179 Osteoarthritis of knee, unspecified: Secondary | ICD-10-CM | POA: Diagnosis not present

## 2016-08-10 DIAGNOSIS — G4733 Obstructive sleep apnea (adult) (pediatric): Secondary | ICD-10-CM | POA: Diagnosis not present

## 2016-08-10 DIAGNOSIS — E7800 Pure hypercholesterolemia, unspecified: Secondary | ICD-10-CM | POA: Diagnosis not present

## 2016-08-12 DIAGNOSIS — G4733 Obstructive sleep apnea (adult) (pediatric): Secondary | ICD-10-CM | POA: Diagnosis not present

## 2016-08-18 DIAGNOSIS — M1612 Unilateral primary osteoarthritis, left hip: Secondary | ICD-10-CM | POA: Diagnosis not present

## 2016-08-18 DIAGNOSIS — M170 Bilateral primary osteoarthritis of knee: Secondary | ICD-10-CM | POA: Diagnosis not present

## 2016-08-22 DIAGNOSIS — H52223 Regular astigmatism, bilateral: Secondary | ICD-10-CM | POA: Diagnosis not present

## 2016-08-22 DIAGNOSIS — H11153 Pinguecula, bilateral: Secondary | ICD-10-CM | POA: Diagnosis not present

## 2016-08-22 DIAGNOSIS — H18413 Arcus senilis, bilateral: Secondary | ICD-10-CM | POA: Diagnosis not present

## 2016-08-22 DIAGNOSIS — H2513 Age-related nuclear cataract, bilateral: Secondary | ICD-10-CM | POA: Diagnosis not present

## 2016-08-22 DIAGNOSIS — H5203 Hypermetropia, bilateral: Secondary | ICD-10-CM | POA: Diagnosis not present

## 2016-08-22 DIAGNOSIS — H524 Presbyopia: Secondary | ICD-10-CM | POA: Diagnosis not present

## 2016-08-24 DIAGNOSIS — G4733 Obstructive sleep apnea (adult) (pediatric): Secondary | ICD-10-CM | POA: Diagnosis not present

## 2016-09-01 DIAGNOSIS — L821 Other seborrheic keratosis: Secondary | ICD-10-CM | POA: Diagnosis not present

## 2016-09-01 DIAGNOSIS — L570 Actinic keratosis: Secondary | ICD-10-CM | POA: Diagnosis not present

## 2016-09-01 DIAGNOSIS — L089 Local infection of the skin and subcutaneous tissue, unspecified: Secondary | ICD-10-CM | POA: Diagnosis not present

## 2016-09-09 DIAGNOSIS — E7800 Pure hypercholesterolemia, unspecified: Secondary | ICD-10-CM | POA: Diagnosis not present

## 2016-09-09 DIAGNOSIS — G4733 Obstructive sleep apnea (adult) (pediatric): Secondary | ICD-10-CM | POA: Diagnosis not present

## 2016-09-09 DIAGNOSIS — J301 Allergic rhinitis due to pollen: Secondary | ICD-10-CM | POA: Diagnosis not present

## 2016-09-09 DIAGNOSIS — M179 Osteoarthritis of knee, unspecified: Secondary | ICD-10-CM | POA: Diagnosis not present

## 2016-10-05 DIAGNOSIS — Z01818 Encounter for other preprocedural examination: Secondary | ICD-10-CM | POA: Diagnosis not present

## 2016-10-05 DIAGNOSIS — J0120 Acute ethmoidal sinusitis, unspecified: Secondary | ICD-10-CM | POA: Diagnosis not present

## 2016-10-10 DIAGNOSIS — J301 Allergic rhinitis due to pollen: Secondary | ICD-10-CM | POA: Diagnosis not present

## 2016-10-10 DIAGNOSIS — G4733 Obstructive sleep apnea (adult) (pediatric): Secondary | ICD-10-CM | POA: Diagnosis not present

## 2016-10-10 DIAGNOSIS — E7800 Pure hypercholesterolemia, unspecified: Secondary | ICD-10-CM | POA: Diagnosis not present

## 2016-10-10 DIAGNOSIS — M179 Osteoarthritis of knee, unspecified: Secondary | ICD-10-CM | POA: Diagnosis not present

## 2016-10-13 DIAGNOSIS — Z23 Encounter for immunization: Secondary | ICD-10-CM | POA: Diagnosis not present

## 2016-11-09 DIAGNOSIS — J301 Allergic rhinitis due to pollen: Secondary | ICD-10-CM | POA: Diagnosis not present

## 2016-11-09 DIAGNOSIS — G4733 Obstructive sleep apnea (adult) (pediatric): Secondary | ICD-10-CM | POA: Diagnosis not present

## 2016-11-09 DIAGNOSIS — M179 Osteoarthritis of knee, unspecified: Secondary | ICD-10-CM | POA: Diagnosis not present

## 2016-11-09 DIAGNOSIS — E7800 Pure hypercholesterolemia, unspecified: Secondary | ICD-10-CM | POA: Diagnosis not present

## 2016-12-07 DIAGNOSIS — G4733 Obstructive sleep apnea (adult) (pediatric): Secondary | ICD-10-CM | POA: Diagnosis not present

## 2016-12-10 DIAGNOSIS — E7800 Pure hypercholesterolemia, unspecified: Secondary | ICD-10-CM | POA: Diagnosis not present

## 2016-12-10 DIAGNOSIS — G4733 Obstructive sleep apnea (adult) (pediatric): Secondary | ICD-10-CM | POA: Diagnosis not present

## 2016-12-10 DIAGNOSIS — J301 Allergic rhinitis due to pollen: Secondary | ICD-10-CM | POA: Diagnosis not present

## 2016-12-10 DIAGNOSIS — M179 Osteoarthritis of knee, unspecified: Secondary | ICD-10-CM | POA: Diagnosis not present

## 2016-12-27 ENCOUNTER — Ambulatory Visit: Payer: Self-pay | Admitting: Orthopedic Surgery

## 2017-01-04 ENCOUNTER — Other Ambulatory Visit (HOSPITAL_COMMUNITY): Payer: Self-pay | Admitting: Emergency Medicine

## 2017-01-04 NOTE — Progress Notes (Signed)
LOV/ medical clearance Dr Bertram Millard 10-05-16 on chart

## 2017-01-04 NOTE — Patient Instructions (Addendum)
JAMARRE GEISS  01/04/2017   Your procedure is scheduled on: 01-11-17  Report to [facility]  Entrance take [facility]  elevators to 3rd floor to  [place] at 6:00AM.  Call this number if you have problems the morning of surgery [phone]   Remember: ONLY 1 PERSON MAY GO WITH YOU TO SHORT STAY TO GET  READY MORNING OF Ward.  Do not eat food or drink liquids :After Midnight.     Take these medicines the morning of surgery with A SIP OF WATER: Nexium, Montelukast, Nasacort as needed, eye drops as needed                                 You may not have any metal on your body including hair pins and              piercings  Do not wear jewelry, make-up, lotions, powders or perfumes, deodorant             Do not wear nail polish.  Do not shave  48 hours prior to surgery.              Men may shave face and neck.   Do not bring valuables to the hospital. [place].  Contacts, dentures or bridgework may not be worn into surgery.  Leave suitcase in the car. After surgery it may be brought to your room.              Please read over the following fact sheets you were given: _____________________________________________________________________             [facility] - Preparing for Surgery Before surgery, you can play an important role.  Because skin is not sterile, your skin needs to be as free of germs as possible.  You can reduce the number of germs on your skin by washing with CHG (chlorahexidine gluconate) soap before surgery.  CHG is an antiseptic cleaner which kills germs and bonds with the skin to continue killing germs even after washing. Please DO NOT use if you have an allergy to CHG or antibacterial soaps.  If your skin becomes reddened/irritated stop using the CHG and inform your nurse when you arrive at Short Stay. Do not shave (including legs and underarms) for at least 48 hours prior to  the first CHG shower.  You may shave your face/neck. Please follow these instructions carefully:  1.  Shower with CHG Soap the night before surgery and the  morning of Surgery.  2.  If you choose to wash your hair, wash your hair first as usual with your  normal  shampoo.  3.  After you shampoo, rinse your hair and body thoroughly to remove the  shampoo.                           4.  Use CHG as you would any other liquid soap.  You can apply chg directly  to the skin and wash                       Gently with a scrungie or clean washcloth.  5.  Apply the CHG Soap to your body ONLY FROM THE NECK DOWN.   Do not use on face/ open                           Wound or open sores. Avoid contact with eyes, ears mouth and genitals (private parts).                       Wash face,  Genitals (private parts) with your normal soap.             6.  Wash thoroughly, paying special attention to the area where your surgery  will be performed.  7.  Thoroughly rinse your body with warm water from the neck down.  8.  DO NOT shower/wash with your normal soap after using and rinsing off  the CHG Soap.                9.  Pat yourself dry with a clean towel.            10.  Wear clean pajamas.            11.  Place clean sheets on your bed the night of your first shower and do not  sleep with pets. Day of Surgery : Do not apply any lotions/deodorants the morning of surgery.  Please wear clean clothes to the hospital/surgery center.  FAILURE TO FOLLOW THESE INSTRUCTIONS MAY RESULT IN THE CANCELLATION OF YOUR SURGERY PATIENT SIGNATURE_________________________________  NURSE SIGNATURE__________________________________  ________________________________________________________________________   Adam Phenix  An incentive spirometer is a tool that can help keep your lungs clear and active. This tool measures how well you are filling your lungs with each breath. Taking long deep breaths may help reverse or  decrease the chance of developing breathing (pulmonary) problems (especially infection) following:  A long period of time when you are unable to move or be active. BEFORE THE PROCEDURE   If the spirometer includes an indicator to show your best effort, your nurse or respiratory therapist will set it to a desired goal.  If possible, sit up straight or lean slightly forward. Try not to slouch.  Hold the incentive spirometer in an upright position. INSTRUCTIONS FOR USE  1. Sit on the edge of your bed if possible, or sit up as far as you can in bed or on a chair. 2. Hold the incentive spirometer in an upright position. 3. Breathe out normally. 4. Place the mouthpiece in your mouth and seal your lips tightly around it. 5. Breathe in slowly and as deeply as possible, raising the piston or the ball toward the top of the column. 6. Hold your breath for 3-5 seconds or for as long as possible. Allow the piston or ball to fall to the bottom of the column. 7. Remove the mouthpiece from your mouth and breathe out normally. 8. Rest for a few seconds and repeat Steps 1 through 7 at least 10 times every 1-2 hours when you are awake. Take your time and take a few normal breaths between deep breaths. 9. The spirometer may include an indicator to show your best effort. Use the indicator as a goal to work toward during each repetition. 10. After each set of 10 deep breaths, practice coughing to be sure your lungs are clear. If you have an incision (the cut made at the time of surgery), support your incision when coughing by placing a pillow or  rolled up towels firmly against it. Once you are able to get out of bed, walk around indoors and cough well. You may stop using the incentive spirometer when instructed by your caregiver.  RISKS AND COMPLICATIONS  Take your time so you do not get dizzy or light-headed.  If you are in pain, you may need to take or ask for pain medication before doing incentive spirometry.  It is harder to take a deep breath if you are having pain. AFTER USE  Rest and breathe slowly and easily.  It can be helpful to keep track of a log of your progress. Your caregiver can provide you with a simple table to help with this. If you are using the spirometer at home, follow these instructions: [place] IF:   You are having difficultly using the spirometer.  You have trouble using the spirometer as often as instructed.  Your pain medication is not giving enough relief while using the spirometer.  You develop fever of 100.5 F (38.1 C) or higher. SEEK IMMEDIATE MEDICAL CARE IF:   You cough up bloody sputum that had not been present before.  You develop fever of 102 F (38.9 C) or greater.  You develop worsening pain at or near the incision site. MAKE SURE YOU:   Understand these instructions.  Will watch your condition.  Will get help right away if you are not doing well or get worse. Document Released: 04/03/2007 Document Revised: 02/13/2012 Document Reviewed: 06/04/2007 ExitCare Patient Information 2014 ExitCare, [place].   ________________________________________________________________________  WHAT IS A BLOOD TRANSFUSION? Blood Transfusion Information  A transfusion is the replacement of blood or some of its parts. Blood is made up of multiple cells which provide different functions.  Red blood cells carry oxygen and are used for blood loss replacement.  White blood cells fight against infection.  Platelets control bleeding.  Plasma helps clot blood.  Other blood products are available for specialized needs, such as hemophilia or other clotting disorders. BEFORE THE TRANSFUSION  Who gives blood for transfusions?   Healthy volunteers who are fully evaluated to make sure their blood is safe. This is blood bank blood. Transfusion therapy is the safest it has ever been in the practice of medicine. Before blood is taken from a donor, a complete  history is taken to make sure that person has no history of diseases nor engages in risky social behavior (examples are intravenous drug use or sexual activity with multiple partners). The donor's travel history is screened to minimize risk of transmitting infections, such as malaria. The donated blood is tested for signs of infectious diseases, such as HIV and hepatitis. The blood is then tested to be sure it is compatible with you in order to minimize the chance of a transfusion reaction. If you or a relative donates blood, this is often done in anticipation of surgery and is not appropriate for emergency situations. It takes many days to process the donated blood. RISKS AND COMPLICATIONS Although transfusion therapy is very safe and saves many lives, the main dangers of transfusion include:   Getting an infectious disease.  Developing a transfusion reaction. This is an allergic reaction to something in the blood you were given. Every precaution is taken to prevent this. The decision to have a blood transfusion has been considered carefully by your caregiver before blood is given. Blood is not given unless the benefits outweigh the risks. AFTER THE TRANSFUSION  Right after receiving a blood transfusion, you will usually  feel much better and more energetic. This is especially true if your red blood cells have gotten low (anemic). The transfusion raises the level of the red blood cells which carry oxygen, and this usually causes an energy increase.  The nurse administering the transfusion will monitor you carefully for complications. HOME CARE INSTRUCTIONS  No special instructions are needed after a transfusion. You may find your energy is better. Speak with your caregiver about any limitations on activity for underlying diseases you may have. SEEK MEDICAL CARE IF:   Your condition is not improving after your transfusion.  You develop redness or irritation at the intravenous (IV) site. SEEK  IMMEDIATE MEDICAL CARE IF:  Any of the following symptoms occur over the next 12 hours:  Shaking chills.  You have a temperature by mouth above 102 F (38.9 C), not controlled by medicine.  Chest, back, or muscle pain.  People around you feel you are not acting correctly or are confused.  Shortness of breath or difficulty breathing.  Dizziness and fainting.  You get a rash or develop hives.  You have a decrease in urine output.  Your urine turns a dark color or changes to pink, red, or brown. Any of the following symptoms occur over the next 10 days:  You have a temperature by mouth above 102 F (38.9 C), not controlled by medicine.  Shortness of breath.  Weakness after normal activity.  The white part of the eye turns yellow (jaundice).  You have a decrease in the amount of urine or are urinating less often.  Your urine turns a dark color or changes to pink, red, or brown. Document Released: 11/18/2000 Document Revised: 02/13/2012 Document Reviewed: 07/07/2008 [employer] Patient Information 2014 [place], [place].  _______________________________________________________________________

## 2017-01-05 ENCOUNTER — Encounter (HOSPITAL_COMMUNITY)
Admission: RE | Admit: 2017-01-05 | Discharge: 2017-01-05 | Disposition: A | Discharge status: Home / Self Care | Payer: Medicare HMO | Source: Ambulatory Visit | Attending: Orthopedic Surgery | Admitting: Orthopedic Surgery

## 2017-01-05 ENCOUNTER — Encounter (HOSPITAL_COMMUNITY): Payer: Self-pay

## 2017-01-05 DIAGNOSIS — Z01812 Encounter for preprocedural laboratory examination: Secondary | ICD-10-CM | POA: Diagnosis not present

## 2017-01-05 DIAGNOSIS — Z0183 Encounter for blood typing: Secondary | ICD-10-CM | POA: Insufficient documentation

## 2017-01-05 DIAGNOSIS — M1612 Unilateral primary osteoarthritis, left hip: Secondary | ICD-10-CM | POA: Diagnosis not present

## 2017-01-05 HISTORY — DX: Male erectile dysfunction, unspecified: N52.9

## 2017-01-05 HISTORY — DX: Sleep apnea, unspecified: G47.30

## 2017-01-05 LAB — SURGICAL PCR SCREEN
MRSA, PCR: NEGATIVE
Staphylococcus aureus: NEGATIVE

## 2017-01-05 LAB — CBC
HCT: 48 % (ref 39.0–52.0)
Hemoglobin: 16 g/dL (ref 13.0–17.0)
MCH: 30.9 pg (ref 26.0–34.0)
MCHC: 33.3 g/dL (ref 30.0–36.0)
MCV: 92.8 fL (ref 78.0–100.0)
Platelets: 257 10*3/uL (ref 150–400)
RBC: 5.17 MIL/uL (ref 4.22–5.81)
RDW: 13.5 % (ref 11.5–15.5)
WBC: 9.1 10*3/uL (ref 4.0–10.5)

## 2017-01-05 LAB — COMPREHENSIVE METABOLIC PANEL
ALBUMIN: 4.4 g/dL (ref 3.5–5.0)
ALT: 21 U/L (ref 17–63)
AST: 24 U/L (ref 15–41)
Alkaline Phosphatase: 80 U/L (ref 38–126)
Anion gap: 8 (ref 5–15)
BUN: 12 mg/dL (ref 6–20)
CO2: 27 mmol/L (ref 22–32)
Calcium: 9.4 mg/dL (ref 8.9–10.3)
Chloride: 103 mmol/L (ref 101–111)
Creatinine, Ser: 1.26 mg/dL — ABNORMAL HIGH (ref 0.61–1.24)
GFR calc Af Amer: >60 mL/min (ref >60)
GFR calc non Af Amer: 57 mL/min — ABNORMAL LOW (ref >60)
GLUCOSE: 109 mg/dL — AB (ref 65–99)
POTASSIUM: 4.4 mmol/L (ref 3.5–5.1)
SODIUM: 138 mmol/L (ref 135–145)
TOTAL PROTEIN: 7.9 g/dL (ref 6.5–8.1)
Total Bilirubin: 1.1 mg/dL (ref 0.3–1.2)

## 2017-01-05 LAB — APTT: APTT: 29 s (ref 24–36)

## 2017-01-05 LAB — PROTIME-INR
INR: 0.95
Prothrombin Time: 12.7 seconds (ref 11.4–15.2)

## 2017-01-06 DIAGNOSIS — N5201 Erectile dysfunction due to arterial insufficiency: Secondary | ICD-10-CM | POA: Diagnosis not present

## 2017-01-06 DIAGNOSIS — Z8546 Personal history of malignant neoplasm of prostate: Secondary | ICD-10-CM | POA: Diagnosis not present

## 2017-01-06 DIAGNOSIS — R6882 Decreased libido: Secondary | ICD-10-CM | POA: Diagnosis not present

## 2017-01-10 ENCOUNTER — Ambulatory Visit: Payer: Self-pay | Admitting: Orthopedic Surgery

## 2017-01-10 DIAGNOSIS — J301 Allergic rhinitis due to pollen: Secondary | ICD-10-CM | POA: Diagnosis not present

## 2017-01-10 DIAGNOSIS — E7800 Pure hypercholesterolemia, unspecified: Secondary | ICD-10-CM | POA: Diagnosis not present

## 2017-01-10 DIAGNOSIS — G4733 Obstructive sleep apnea (adult) (pediatric): Secondary | ICD-10-CM | POA: Diagnosis not present

## 2017-01-10 DIAGNOSIS — M179 Osteoarthritis of knee, unspecified: Secondary | ICD-10-CM | POA: Diagnosis not present

## 2017-01-10 NOTE — H&P (Signed)
Benjamin Rivas DOB: 1946-08-07 Single / Language: English / Race: White Male Date Of Admission:  01/11/2017 CC:  Left hip pain History of Present Illness  The patient is a 71 year old male who comes in for a preoperative History and Physical. The patient is scheduled for a left total hip arthroplasty (anterior) to be performed by Dr. Dione Plover. Aluisio, MD at [facility] on 01-11-2017. The patient is a 71 year old male who presented for follow up of their hip. The patient is being followed for their left hip pain and osteoarthritis. Symptoms reported include: pain, aching and pain with weightbearing. The patient feels that they are doing poorly and report their pain level to be moderate. The following medication has been used for pain control: antiinflammatory medication (ibuprofen 800MG ). He had an intra-articular cortisone injection with Dr. Nelva Bush about 3 months ago. He did not see any relief with the injection. He has had to stop certain things, such as pickle ball, that he enjoys because of the hip. He plays the drums at church and is finding it increasingly difficult to do. He is at a point where he wants to get the hip replaced.  He states that the left hip is getting progressively worse over time that a point now where is hurting with almost a activities. Even hurts at rest. It is limiting what he can and cannot do. He had an intra-articular injection about three months ago, which has not provided tremendous amount of benefit. He is ready to proceed with surgery. They have been treated conservatively in the past for the above stated problem and despite conservative measures, they continue to have progressive pain and severe functional limitations and dysfunction. They have failed non-operative management including home exercise, medications, and injections. It is felt that they would benefit from undergoing total joint replacement. Risks and benefits of the procedure have been discussed with the  patient and they elect to proceed with surgery. There are no active contraindications to surgery such as ongoing infection or rapidly progressive neurological disease.  Problem List/Past Medical Chronic pain of left knee (M25.562)  Primary osteoarthritis of left hip (M16.12)  Primary osteoarthritis of both knees (M17.0)  Bronchitis  Pneumonia  Sleep Apnea  uses CPAP Gastroesophageal Reflux Disease  Hemorrhoids  Prostate Cancer  Measles  Mumps  Hypercholesterolemia  Allergic Rhinitis   Allergies No Known Drug Allergies   Family History Chronic Obstructive Lung Disease  Brother. Drug / Alcohol Addiction  Brother.  Social History  Children  3 Current work status  retired Furniture conservator/restorer weekly; does gym / [employer] Living situation  live with spouse Marital status  married Most recent primary occupation  none Not under pain contract  Number of flights of stairs before winded  greater than 5 Tobacco / smoke exposure  11/03/2014: no Tobacco use  Never smoker. 11/03/2014 Advance Directives  Living Will, Healthcare POA  Medication History Multiple Vitamin (1 (one) Oral) Active. Singulair (10MG  Tablet, Oral) Active. Nasacort Allergy 24HR (55MCG/ACT Aerosol, Nasal) Active. NexIUM (20MG  Capsule DR, Oral) Active. Famotidine (20MG  Tablet, Oral) Active.    Review of Systems General Not Present- Chills, Fatigue, Fever, Memory Loss, Night Sweats, Weight Gain and Weight Loss. Skin Not Present- Eczema, Hives, Itching, Lesions and Rash. HEENT Present- Blurred Vision, Hearing Loss and Tinnitus. Not Present- Dentures, Double Vision, Headache and Visual Loss. Respiratory Present- Allergies. Not Present- Chronic Cough, Coughing up blood, Shortness of breath at rest and Shortness of breath with exertion. Cardiovascular  Not Present- Chest Pain, Difficulty Breathing Lying Down, Murmur, Palpitations, Racing/skipping heartbeats and Swelling. Gastrointestinal  Not Present- Abdominal Pain, Bloody Stool, Constipation, Diarrhea, Difficulty Swallowing, Heartburn, Jaundice, Loss of appetitie, Nausea and Vomiting. Male Genitourinary Present- Incontinence. Not Present- Blood in Urine, Discharge, Flank Pain, Painful Urination, Urgency, Urinary frequency, Urinary Retention, Urinating at Night and Weak urinary stream. Musculoskeletal Present- Morning Stiffness. Not Present- Back Pain, Joint Pain, Joint Swelling, Muscle Pain, Muscle Weakness and Spasms. Neurological Not Present- Blackout spells, Difficulty with balance, Dizziness, Paralysis, Tremor and Weakness. Psychiatric Not Present- Insomnia.  Vitals  Weight: 190 lb Height: 66in Body Surface Area: 1.96 m Body Mass Index: 30.67 kg/m  Pulse: 80 (Regular)  BP: 148/78 (Sitting, Right Arm, Standard)    Physical Exam General Mental Status -Alert, cooperative and good historian. General Appearance-pleasant, Not in acute distress. Orientation-Oriented X3. Build & Nutrition-Well nourished and Well developed.  Head and Neck Head-normocephalic, atraumatic . Neck Global Assessment - supple, no bruit auscultated on the right, no bruit auscultated on the left.  Eye Pupil - Bilateral-Regular and Round. Motion - Bilateral-EOMI.  Chest and Lung Exam Auscultation Breath sounds - clear at anterior chest wall and clear at posterior chest wall. Adventitious sounds - No Adventitious sounds.  Cardiovascular Auscultation Rhythm - Regular rate and rhythm. Heart Sounds - S1 WNL and S2 WNL. Murmurs & Other Heart Sounds - Auscultation of the heart reveals - No Murmurs.  Abdomen Palpation/Percussion Tenderness - Abdomen is non-tender to palpation. Rigidity (guarding) - Abdomen is soft. Auscultation Auscultation of the abdomen reveals - Bowel sounds normal.  Male Genitourinary Note: Not done, not pertinent to present illness   Musculoskeletal Note: On exam, he is alert and oriented,  in no apparent distress. He walks with an antalgic gait pattern in the left side. His left is able to be flexed to 100 with no internal rotation, about 20 external rotation, 20 abduction. Right hip flexion 120, rotation in 30, out 40, abduction 40.  RADIOGRAPHS Reviewed. AP pelvis, lateral left hip. He has got bone-on-bone arthritis with subchondral cystic formation and osteophyte formation.   Assessment & Plan Primary osteoarthritis of left hip (M16.12)  Note:Surgical Plans: Left Total Hip Replacement - Anterior Approach  Disposition: Home  PCP: Dr. Chancy Milroy  Topical TXA  Anesthesia Issues: Did have an episode of "stopped breathing" after a previous scope procedure.  Patient was instructed on what medications to stop prior to surgery.  Signed electronically by Ok Edwards, III PA-C

## 2017-01-11 ENCOUNTER — Encounter (HOSPITAL_COMMUNITY)
Admission: RE | Disposition: A | Discharge status: Home Health | Payer: Self-pay | Source: Ambulatory Visit | Attending: Orthopedic Surgery

## 2017-01-11 ENCOUNTER — Encounter (HOSPITAL_COMMUNITY): Payer: Self-pay | Admitting: *Deleted

## 2017-01-11 ENCOUNTER — Inpatient Hospital Stay (HOSPITAL_COMMUNITY): Payer: Medicare HMO | Admitting: Certified Registered"

## 2017-01-11 ENCOUNTER — Inpatient Hospital Stay (HOSPITAL_COMMUNITY): Payer: Medicare HMO

## 2017-01-11 ENCOUNTER — Inpatient Hospital Stay (HOSPITAL_COMMUNITY)
Admission: RE | Admit: 2017-01-11 | Discharge: 2017-01-12 | DRG: 470 | Disposition: A | Discharge status: Home Health | Payer: Medicare HMO | Source: Ambulatory Visit | Attending: Orthopedic Surgery | Admitting: Orthopedic Surgery

## 2017-01-11 DIAGNOSIS — Z6830 Body mass index (BMI) 30.0-30.9, adult: Secondary | ICD-10-CM | POA: Diagnosis not present

## 2017-01-11 DIAGNOSIS — Z8546 Personal history of malignant neoplasm of prostate: Secondary | ICD-10-CM

## 2017-01-11 DIAGNOSIS — Z96649 Presence of unspecified artificial hip joint: Secondary | ICD-10-CM

## 2017-01-11 DIAGNOSIS — M25552 Pain in left hip: Secondary | ICD-10-CM | POA: Diagnosis present

## 2017-01-11 DIAGNOSIS — G4730 Sleep apnea, unspecified: Secondary | ICD-10-CM | POA: Diagnosis present

## 2017-01-11 DIAGNOSIS — M169 Osteoarthritis of hip, unspecified: Secondary | ICD-10-CM | POA: Diagnosis not present

## 2017-01-11 DIAGNOSIS — K219 Gastro-esophageal reflux disease without esophagitis: Secondary | ICD-10-CM | POA: Diagnosis present

## 2017-01-11 DIAGNOSIS — Z96642 Presence of left artificial hip joint: Secondary | ICD-10-CM | POA: Diagnosis not present

## 2017-01-11 DIAGNOSIS — M1612 Unilateral primary osteoarthritis, left hip: Secondary | ICD-10-CM | POA: Diagnosis not present

## 2017-01-11 DIAGNOSIS — Z471 Aftercare following joint replacement surgery: Secondary | ICD-10-CM | POA: Diagnosis not present

## 2017-01-11 DIAGNOSIS — Z79899 Other long term (current) drug therapy: Secondary | ICD-10-CM

## 2017-01-11 HISTORY — PX: TOTAL HIP ARTHROPLASTY: SHX124

## 2017-01-11 LAB — TYPE AND SCREEN
ABO/RH(D): A POS
ANTIBODY SCREEN: NEGATIVE

## 2017-01-11 SURGERY — ARTHROPLASTY, HIP, TOTAL, ANTERIOR APPROACH
Anesthesia: General | Site: Hip | Laterality: Left

## 2017-01-11 MED ORDER — DIPHENHYDRAMINE HCL 12.5 MG/5ML PO ELIX: 12.5000 mg | ORAL_SOLUTION | ORAL | Status: DC | PRN

## 2017-01-11 MED ORDER — FLEET ENEMA 7-19 GM/118ML RE ENEM: 1.0000 | ENEMA | Freq: Once | RECTAL | Status: DC | PRN

## 2017-01-11 MED ORDER — METHOCARBAMOL 1000 MG/10ML IJ SOLN
500.0000 mg | Freq: Four times a day (QID) | INTRAVENOUS | Status: DC | PRN
  Administered 2017-01-11: 500 mg via INTRAVENOUS
  Filled 2017-01-11: qty 5
  Filled 2017-01-11: qty 550

## 2017-01-11 MED ORDER — ROCURONIUM BROMIDE 10 MG/ML (PF) SYRINGE
PREFILLED_SYRINGE | INTRAVENOUS | Status: DC | PRN
  Administered 2017-01-11: 5 mg via INTRAVENOUS
  Administered 2017-01-11: 30 mg via INTRAVENOUS

## 2017-01-11 MED ORDER — ONDANSETRON HCL 4 MG/2ML IJ SOLN
INTRAMUSCULAR | Status: DC | PRN
  Administered 2017-01-11: 4 mg via INTRAVENOUS

## 2017-01-11 MED ORDER — TRAMADOL HCL 50 MG PO TABS
50.0000 mg | ORAL_TABLET | Freq: Four times a day (QID) | ORAL | Status: DC | PRN
  Administered 2017-01-12: 06:00:00 100 mg via ORAL
  Filled 2017-01-11: qty 2

## 2017-01-11 MED ORDER — DOCUSATE SODIUM 100 MG PO CAPS
100.0000 mg | ORAL_CAPSULE | Freq: Two times a day (BID) | ORAL | Status: DC
  Administered 2017-01-11 – 2017-01-12 (×3): 100 mg via ORAL
  Filled 2017-01-11 (×3): qty 1

## 2017-01-11 MED ORDER — METOCLOPRAMIDE HCL 5 MG PO TABS: 5.0000 mg | ORAL_TABLET | Freq: Three times a day (TID) | ORAL | Status: DC | PRN

## 2017-01-11 MED ORDER — METOCLOPRAMIDE HCL 5 MG/ML IJ SOLN: 5.0000 mg | Freq: Three times a day (TID) | INTRAMUSCULAR | Status: DC | PRN

## 2017-01-11 MED ORDER — METHOCARBAMOL 500 MG PO TABS
500.0000 mg | ORAL_TABLET | Freq: Four times a day (QID) | ORAL | Status: DC | PRN
  Administered 2017-01-11: 500 mg via ORAL
  Filled 2017-01-11: qty 1

## 2017-01-11 MED ORDER — PROMETHAZINE HCL 25 MG/ML IJ SOLN
INTRAMUSCULAR | Status: AC
  Filled 2017-01-11: qty 1

## 2017-01-11 MED ORDER — HYDROMORPHONE HCL 1 MG/ML IJ SOLN
INTRAMUSCULAR | Status: AC
  Filled 2017-01-11: qty 1

## 2017-01-11 MED ORDER — ONDANSETRON HCL 4 MG/2ML IJ SOLN
INTRAMUSCULAR | Status: AC
  Filled 2017-01-11: qty 2

## 2017-01-11 MED ORDER — ACETAMINOPHEN 10 MG/ML IV SOLN
INTRAVENOUS | Status: AC
  Filled 2017-01-11: qty 100

## 2017-01-11 MED ORDER — MIDAZOLAM HCL 2 MG/2ML IJ SOLN
INTRAMUSCULAR | Status: AC
  Filled 2017-01-11: qty 2

## 2017-01-11 MED ORDER — ACETAMINOPHEN 10 MG/ML IV SOLN
1000.0000 mg | Freq: Once | INTRAVENOUS | Status: AC
  Administered 2017-01-11: 1000 mg via INTRAVENOUS

## 2017-01-11 MED ORDER — SODIUM CHLORIDE 0.9 % IV BOLUS (SEPSIS)
250.0000 mL | Freq: Once | INTRAVENOUS | Status: AC
  Administered 2017-01-11: 18:00:00 250 mL via INTRAVENOUS

## 2017-01-11 MED ORDER — DEXAMETHASONE SODIUM PHOSPHATE 10 MG/ML IJ SOLN
INTRAMUSCULAR | Status: AC
  Filled 2017-01-11: qty 1

## 2017-01-11 MED ORDER — DEXAMETHASONE SODIUM PHOSPHATE 10 MG/ML IJ SOLN
10.0000 mg | Freq: Once | INTRAMUSCULAR | Status: AC
  Administered 2017-01-11: 10 mg via INTRAVENOUS

## 2017-01-11 MED ORDER — LACTATED RINGERS IV SOLN: INTRAVENOUS | Status: DC

## 2017-01-11 MED ORDER — FENTANYL CITRATE (PF) 100 MCG/2ML IJ SOLN
INTRAMUSCULAR | Status: AC
  Filled 2017-01-11: qty 2

## 2017-01-11 MED ORDER — PHENYLEPHRINE 40 MCG/ML (10ML) SYRINGE FOR IV PUSH (FOR BLOOD PRESSURE SUPPORT)
PREFILLED_SYRINGE | INTRAVENOUS | Status: AC
  Filled 2017-01-11: qty 10

## 2017-01-11 MED ORDER — ALBUMIN HUMAN 5 % IV SOLN
INTRAVENOUS | Status: AC
  Filled 2017-01-11: qty 500

## 2017-01-11 MED ORDER — CEFAZOLIN SODIUM-DEXTROSE 2-4 GM/100ML-% IV SOLN
2.0000 g | INTRAVENOUS | Status: AC
  Administered 2017-01-11: 2 g via INTRAVENOUS

## 2017-01-11 MED ORDER — CHLORHEXIDINE GLUCONATE 4 % EX LIQD: 60.0000 mL | Freq: Once | CUTANEOUS | Status: DC

## 2017-01-11 MED ORDER — TRANEXAMIC ACID 1000 MG/10ML IV SOLN
INTRAVENOUS | Status: DC | PRN
  Administered 2017-01-11: 2000 mg via TOPICAL

## 2017-01-11 MED ORDER — PHENOL 1.4 % MT LIQD
1.0000 | OROMUCOSAL | Status: DC | PRN
  Filled 2017-01-11: qty 177

## 2017-01-11 MED ORDER — MENTHOL 3 MG MT LOZG: 1.0000 | LOZENGE | OROMUCOSAL | Status: DC | PRN

## 2017-01-11 MED ORDER — OXYCODONE HCL 5 MG PO TABS
5.0000 mg | ORAL_TABLET | ORAL | Status: DC | PRN
  Administered 2017-01-11 (×2): 10 mg via ORAL
  Administered 2017-01-11 (×2): 5 mg via ORAL
  Administered 2017-01-12 (×2): 10 mg via ORAL
  Filled 2017-01-11 (×3): qty 2
  Filled 2017-01-11 (×2): qty 1
  Filled 2017-01-11: qty 2

## 2017-01-11 MED ORDER — HYDROMORPHONE HCL 1 MG/ML IJ SOLN
0.2500 mg | INTRAMUSCULAR | Status: DC | PRN
  Administered 2017-01-11 (×3): 0.5 mg via INTRAVENOUS

## 2017-01-11 MED ORDER — LIDOCAINE 2% (20 MG/ML) 5 ML SYRINGE
INTRAMUSCULAR | Status: DC | PRN
  Administered 2017-01-11: 60 mg via INTRAVENOUS

## 2017-01-11 MED ORDER — LACTATED RINGERS IV SOLN
INTRAVENOUS | Status: DC | PRN
  Administered 2017-01-11: 08:00:00 via INTRAVENOUS

## 2017-01-11 MED ORDER — MORPHINE SULFATE (PF) 2 MG/ML IV SOLN: 1.0000 mg | INTRAVENOUS | Status: DC | PRN

## 2017-01-11 MED ORDER — LIDOCAINE 2% (20 MG/ML) 5 ML SYRINGE
INTRAMUSCULAR | Status: AC
  Filled 2017-01-11: qty 5

## 2017-01-11 MED ORDER — RIVAROXABAN 10 MG PO TABS
10.0000 mg | ORAL_TABLET | Freq: Every day | ORAL | Status: DC
  Administered 2017-01-12: 10 mg via ORAL
  Filled 2017-01-11: qty 1

## 2017-01-11 MED ORDER — SUGAMMADEX SODIUM 200 MG/2ML IV SOLN
INTRAVENOUS | Status: AC
  Filled 2017-01-11: qty 2

## 2017-01-11 MED ORDER — FENTANYL CITRATE (PF) 100 MCG/2ML IJ SOLN
INTRAMUSCULAR | Status: DC | PRN
  Administered 2017-01-11 (×2): 25 ug via INTRAVENOUS
  Administered 2017-01-11: 100 ug via INTRAVENOUS
  Administered 2017-01-11: 25 ug via INTRAVENOUS
  Administered 2017-01-11: 50 ug via INTRAVENOUS
  Administered 2017-01-11: 25 ug via INTRAVENOUS
  Administered 2017-01-11: 50 ug via INTRAVENOUS

## 2017-01-11 MED ORDER — POLYETHYLENE GLYCOL 3350 17 G PO PACK: 17.0000 g | PACK | Freq: Every day | ORAL | Status: DC | PRN

## 2017-01-11 MED ORDER — SODIUM CHLORIDE 0.9 % IV BOLUS (SEPSIS)
250.0000 mL | Freq: Once | INTRAVENOUS | Status: AC
  Administered 2017-01-11: 250 mL via INTRAVENOUS

## 2017-01-11 MED ORDER — CEFAZOLIN SODIUM-DEXTROSE 2-4 GM/100ML-% IV SOLN
2.0000 g | Freq: Four times a day (QID) | INTRAVENOUS | Status: AC
  Administered 2017-01-11 (×2): 2 g via INTRAVENOUS
  Filled 2017-01-11 (×2): qty 100

## 2017-01-11 MED ORDER — BUPIVACAINE HCL (PF) 0.25 % IJ SOLN
INTRAMUSCULAR | Status: AC
  Filled 2017-01-11: qty 30

## 2017-01-11 MED ORDER — ACETAMINOPHEN 325 MG PO TABS: 650.0000 mg | ORAL_TABLET | Freq: Four times a day (QID) | ORAL | Status: DC | PRN

## 2017-01-11 MED ORDER — PHENYLEPHRINE 40 MCG/ML (10ML) SYRINGE FOR IV PUSH (FOR BLOOD PRESSURE SUPPORT)
PREFILLED_SYRINGE | INTRAVENOUS | Status: DC | PRN
  Administered 2017-01-11 (×3): 80 ug via INTRAVENOUS

## 2017-01-11 MED ORDER — BUPIVACAINE HCL (PF) 0.25 % IJ SOLN
INTRAMUSCULAR | Status: DC | PRN
  Administered 2017-01-11: 30 mL

## 2017-01-11 MED ORDER — BISACODYL 10 MG RE SUPP: 10.0000 mg | Freq: Every day | RECTAL | Status: DC | PRN

## 2017-01-11 MED ORDER — PHENYLEPHRINE HCL 10 MG/ML IJ SOLN
INTRAMUSCULAR | Status: AC
  Filled 2017-01-11: qty 1

## 2017-01-11 MED ORDER — ALBUMIN HUMAN 5 % IV SOLN
INTRAVENOUS | Status: AC
Start: 2017-01-11 — End: 2017-01-11
  Filled 2017-01-11: qty 500

## 2017-01-11 MED ORDER — ACETAMINOPHEN 650 MG RE SUPP: 650.0000 mg | Freq: Four times a day (QID) | RECTAL | Status: DC | PRN

## 2017-01-11 MED ORDER — ROCURONIUM BROMIDE 50 MG/5ML IV SOSY
PREFILLED_SYRINGE | INTRAVENOUS | Status: AC
  Filled 2017-01-11: qty 5

## 2017-01-11 MED ORDER — PROPOFOL 10 MG/ML IV BOLUS
INTRAVENOUS | Status: AC
  Filled 2017-01-11: qty 20

## 2017-01-11 MED ORDER — FLUTICASONE PROPIONATE 50 MCG/ACT NA SUSP
1.0000 | Freq: Every day | NASAL | Status: DC
  Filled 2017-01-11: qty 16

## 2017-01-11 MED ORDER — SUGAMMADEX SODIUM 200 MG/2ML IV SOLN
INTRAVENOUS | Status: DC | PRN
  Administered 2017-01-11: 200 mg via INTRAVENOUS

## 2017-01-11 MED ORDER — DEXAMETHASONE SODIUM PHOSPHATE 10 MG/ML IJ SOLN
10.0000 mg | Freq: Once | INTRAMUSCULAR | Status: AC
  Administered 2017-01-12: 10 mg via INTRAVENOUS
  Filled 2017-01-11: qty 1

## 2017-01-11 MED ORDER — ONDANSETRON HCL 4 MG PO TABS: 4.0000 mg | ORAL_TABLET | Freq: Four times a day (QID) | ORAL | Status: DC | PRN

## 2017-01-11 MED ORDER — SUCCINYLCHOLINE CHLORIDE 200 MG/10ML IV SOSY
PREFILLED_SYRINGE | INTRAVENOUS | Status: AC
  Filled 2017-01-11: qty 10

## 2017-01-11 MED ORDER — ACETAMINOPHEN 500 MG PO TABS
1000.0000 mg | ORAL_TABLET | Freq: Four times a day (QID) | ORAL | Status: AC
  Administered 2017-01-11 – 2017-01-12 (×4): 1000 mg via ORAL
  Filled 2017-01-11 (×4): qty 2

## 2017-01-11 MED ORDER — TRANEXAMIC ACID 1000 MG/10ML IV SOLN
2000.0000 mg | Freq: Once | INTRAVENOUS | Status: DC
  Filled 2017-01-11: qty 20

## 2017-01-11 MED ORDER — PROPOFOL 10 MG/ML IV BOLUS
INTRAVENOUS | Status: DC | PRN
  Administered 2017-01-11: 150 mg via INTRAVENOUS
  Administered 2017-01-11: 50 mg via INTRAVENOUS

## 2017-01-11 MED ORDER — MIDAZOLAM HCL 2 MG/2ML IJ SOLN
INTRAMUSCULAR | Status: DC | PRN
  Administered 2017-01-11: 2 mg via INTRAVENOUS

## 2017-01-11 MED ORDER — MONTELUKAST SODIUM 10 MG PO TABS
10.0000 mg | ORAL_TABLET | Freq: Every morning | ORAL | Status: DC
  Administered 2017-01-12: 10 mg via ORAL
  Filled 2017-01-11: qty 1

## 2017-01-11 MED ORDER — SUCCINYLCHOLINE CHLORIDE 200 MG/10ML IV SOSY
PREFILLED_SYRINGE | INTRAVENOUS | Status: DC | PRN
  Administered 2017-01-11: 120 mg via INTRAVENOUS

## 2017-01-11 MED ORDER — PROMETHAZINE HCL 25 MG/ML IJ SOLN
6.2500 mg | INTRAMUSCULAR | Status: DC | PRN
  Administered 2017-01-11: 12.5 mg via INTRAVENOUS

## 2017-01-11 MED ORDER — CEFAZOLIN SODIUM-DEXTROSE 2-4 GM/100ML-% IV SOLN
INTRAVENOUS | Status: AC
  Filled 2017-01-11: qty 100

## 2017-01-11 MED ORDER — PANTOPRAZOLE SODIUM 40 MG PO TBEC
40.0000 mg | DELAYED_RELEASE_TABLET | Freq: Every day | ORAL | Status: DC
  Administered 2017-01-11 – 2017-01-12 (×2): 40 mg via ORAL
  Filled 2017-01-11 (×2): qty 1

## 2017-01-11 MED ORDER — SODIUM CHLORIDE 0.9 % IV SOLN
INTRAVENOUS | Status: DC
  Administered 2017-01-11: 13:00:00 100 mL/h via INTRAVENOUS

## 2017-01-11 MED ORDER — ALBUMIN HUMAN 5 % IV SOLN
INTRAVENOUS | Status: DC | PRN
  Administered 2017-01-11 (×2): via INTRAVENOUS

## 2017-01-11 MED ORDER — ONDANSETRON HCL 4 MG/2ML IJ SOLN: 4.0000 mg | Freq: Four times a day (QID) | INTRAMUSCULAR | Status: DC | PRN

## 2017-01-11 SURGICAL SUPPLY — 34 items
BAG DECANTER FOR FLEXI CONT (MISCELLANEOUS) ×3 IMPLANT
BAG ZIPLOCK 12X15 (MISCELLANEOUS) IMPLANT
BLADE SAG 18X100X1.27 (BLADE) ×3 IMPLANT
CAPT HIP TOTAL 2 ×3 IMPLANT
CLOSURE WOUND 1/2 X4 (GAUZE/BANDAGES/DRESSINGS) ×1
CLOTH BEACON ORANGE TIMEOUT ST (SAFETY) ×3 IMPLANT
COVER PERINEAL POST (MISCELLANEOUS) ×3 IMPLANT
DECANTER SPIKE VIAL GLASS SM (MISCELLANEOUS) ×3 IMPLANT
DRAPE STERI IOBAN 125X83 (DRAPES) ×3 IMPLANT
DRAPE U-SHAPE 47X51 STRL (DRAPES) ×6 IMPLANT
DRSG ADAPTIC 3X8 NADH LF (GAUZE/BANDAGES/DRESSINGS) ×3 IMPLANT
DRSG MEPILEX BORDER 4X4 (GAUZE/BANDAGES/DRESSINGS) ×3 IMPLANT
DRSG MEPILEX BORDER 4X8 (GAUZE/BANDAGES/DRESSINGS) ×3 IMPLANT
DURAPREP 26ML APPLICATOR (WOUND CARE) ×3 IMPLANT
ELECT REM PT RETURN 9FT ADLT (ELECTROSURGICAL) ×3
ELECTRODE REM PT RTRN 9FT ADLT (ELECTROSURGICAL) ×1 IMPLANT
EVACUATOR 1/8 PVC DRAIN (DRAIN) ×3 IMPLANT
GLOVE BIO SURGEON STRL SZ7.5 (GLOVE) ×3 IMPLANT
GLOVE BIO SURGEON STRL SZ8 (GLOVE) ×6 IMPLANT
GLOVE BIOGEL PI IND STRL 8 (GLOVE) ×2 IMPLANT
GLOVE BIOGEL PI INDICATOR 8 (GLOVE) ×4
GOWN STRL REUS W/TWL LRG LVL3 (GOWN DISPOSABLE) ×3 IMPLANT
GOWN STRL REUS W/TWL XL LVL3 (GOWN DISPOSABLE) ×3 IMPLANT
PACK ANTERIOR HIP CUSTOM (KITS) ×3 IMPLANT
STRIP CLOSURE SKIN 1/2X4 (GAUZE/BANDAGES/DRESSINGS) ×2 IMPLANT
SUT ETHIBOND NAB CT1 #1 30IN (SUTURE) ×3 IMPLANT
SUT MNCRL AB 4-0 PS2 18 (SUTURE) ×3 IMPLANT
SUT VIC AB 2-0 CT1 27 (SUTURE) ×4
SUT VIC AB 2-0 CT1 TAPERPNT 27 (SUTURE) ×2 IMPLANT
SUT VLOC 180 0 24IN GS25 (SUTURE) ×3 IMPLANT
SYR 50ML LL SCALE MARK (SYRINGE) IMPLANT
TRAY FOLEY W/METER SILVER 16FR (SET/KITS/TRAYS/PACK) IMPLANT
WATER STERILE IRR 1000ML POUR (IV SOLUTION) ×6 IMPLANT
YANKAUER SUCT BULB TIP 10FT TU (MISCELLANEOUS) ×3 IMPLANT

## 2017-01-11 NOTE — Evaluation (Signed)
Physical Therapy Evaluation Patient Details Name: Benjamin Rivas MRN: [id number] DOB: [birth year] Today's Date: 01/11/2017   History of Present Illness  Pt s/p L THR  Clinical Impression  Pt s/p L THR and presents with decreased L LE strength/ROM and post op pain limiting functional mobility.  Pt should progress to dc home with family assist.    Follow Up Recommendations Home health PT    Equipment Recommendations  Rolling walker with 5" wheels    Recommendations for Other Services OT consult     Precautions / Restrictions Precautions Precautions: Fall Restrictions Weight Bearing Restrictions: No Other Position/Activity Restrictions: WBAT      Mobility  Bed Mobility Overal bed mobility: Needs Assistance Bed Mobility: Supine to Sit     Supine to sit: Min assist;Mod assist     General bed mobility comments: cues for sequence and use of R LE to self assist  Transfers Overall transfer level: Needs assistance Equipment used: Rolling walker (2 wheeled) Transfers: Sit to/from Stand Sit to Stand: Min assist         General transfer comment: cues for LE management and use of UEs to self assist  Ambulation/Gait Ambulation/Gait assistance: Min assist Ambulation Distance (Feet): 50 Feet Assistive device: Rolling walker (2 wheeled) Gait Pattern/deviations: Step-to pattern;Step-through pattern;Decreased step length - right;Decreased step length - left;Shuffle;Trunk flexed Gait velocity: decr Gait velocity interpretation: Below normal speed for age/gender General Gait Details: cues for sequence, posture and position from [employer]            Wheelchair Mobility    Modified Rankin (Stroke Patients Only)       Balance Overall balance assessment: No apparent balance deficits (not formally assessed)                                           Pertinent Vitals/Pain Pain Assessment: 0-10 Pain Score: 3  Pain Location: L hip Pain Descriptors /  Indicators: Aching;Sore Pain Intervention(s): Limited activity within patient's tolerance;Monitored during session;Premedicated before session;Ice applied    Home Living Family/patient expects to be discharged to:: Private residence Living Arrangements: Spouse/significant other Available Help at Discharge: Family Type of Home: House Home Access: Stairs to enter Entrance Stairs-Rails: Psychiatric nurse of Steps: 3 Home Layout: Able to live on main level with bedroom/bathroom Home Equipment: Walker - standard      Prior Function Level of Independence: Independent               Hand Dominance        Extremity/Trunk Assessment   Upper Extremity Assessment Upper Extremity Assessment: Overall WFL for tasks assessed    Lower Extremity Assessment Lower Extremity Assessment: LLE deficits/detail    Cervical / Trunk Assessment Cervical / Trunk Assessment: Normal  Communication   Communication: No difficulties  Cognition Arousal/Alertness: Awake/alert Behavior During Therapy: WFL for tasks assessed/performed Overall Cognitive Status: Within Functional Limits for tasks assessed                      General Comments      Exercises     Assessment/Plan    PT Assessment Patient needs continued PT services  PT Problem List Decreased strength;Decreased range of motion;Decreased activity tolerance;Decreased mobility;Decreased knowledge of use of DME;Pain          PT Treatment Interventions DME instruction;Gait training;Stair training;Functional mobility training;Therapeutic activities;Therapeutic exercise;Patient/family education  PT Goals (Current goals can be found in the Care Plan section)  Acute Rehab PT Goals Patient Stated Goal: Regain IND PT Goal Formulation: With patient Time For Goal Achievement: 01/14/17 Potential to Achieve Goals: Good    Frequency 7X/week   Barriers to discharge        Co-evaluation                End of Session Equipment Utilized During Treatment: Gait belt Activity Tolerance: Patient tolerated treatment well Patient left: in chair;with call bell/phone within reach;with family/visitor present Nurse Communication: Mobility status         Time: 1550-1616 PT Time Calculation (min) (ACUTE ONLY): 26 min   Charges:   PT Evaluation $PT Eval Low Complexity: 1 Procedure PT Treatments $Gait Training: 8-22 mins   PT G Codes:        Benjamin Rivas February 03, 2017, 5:44 PM

## 2017-01-11 NOTE — H&P (View-Only) (Signed)
Benjamin Rivas DOB: Apr 27, 1946 Single / Language: English / Race: White Male Date Of Admission:  01/11/2017 CC:  Left hip pain History of Present Illness  The patient is a 71 year old male who comes in for a preoperative History and Physical. The patient is scheduled for a left total hip arthroplasty (anterior) to be performed by Dr. Dione Plover. Aluisio, MD at [facility] on 01-11-2017. The patient is a 71 year old male who presented for follow up of their hip. The patient is being followed for their left hip pain and osteoarthritis. Symptoms reported include: pain, aching and pain with weightbearing. The patient feels that they are doing poorly and report their pain level to be moderate. The following medication has been used for pain control: antiinflammatory medication (ibuprofen 800MG ). He had an intra-articular cortisone injection with Dr. Nelva Bush about 3 months ago. He did not see any relief with the injection. He has had to stop certain things, such as pickle ball, that he enjoys because of the hip. He plays the drums at church and is finding it increasingly difficult to do. He is at a point where he wants to get the hip replaced.  He states that the left hip is getting progressively worse over time that a point now where is hurting with almost a activities. Even hurts at rest. It is limiting what he can and cannot do. He had an intra-articular injection about three months ago, which has not provided tremendous amount of benefit. He is ready to proceed with surgery. They have been treated conservatively in the past for the above stated problem and despite conservative measures, they continue to have progressive pain and severe functional limitations and dysfunction. They have failed non-operative management including home exercise, medications, and injections. It is felt that they would benefit from undergoing total joint replacement. Risks and benefits of the procedure have been discussed with the  patient and they elect to proceed with surgery. There are no active contraindications to surgery such as ongoing infection or rapidly progressive neurological disease.  Problem List/Past Medical Chronic pain of left knee (M25.562)  Primary osteoarthritis of left hip (M16.12)  Primary osteoarthritis of both knees (M17.0)  Bronchitis  Pneumonia  Sleep Apnea  uses CPAP Gastroesophageal Reflux Disease  Hemorrhoids  Prostate Cancer  Measles  Mumps  Hypercholesterolemia  Allergic Rhinitis   Allergies No Known Drug Allergies   Family History Chronic Obstructive Lung Disease  Brother. Drug / Alcohol Addiction  Brother.  Social History  Children  3 Current work status  retired Furniture conservator/restorer weekly; does gym / [employer] Living situation  live with spouse Marital status  married Most recent primary occupation  none Not under pain contract  Number of flights of stairs before winded  greater than 5 Tobacco / smoke exposure  11/03/2014: no Tobacco use  Never smoker. 11/03/2014 Advance Directives  Living Will, Healthcare POA  Medication History Multiple Vitamin (1 (one) Oral) Active. Singulair (10MG  Tablet, Oral) Active. Nasacort Allergy 24HR (55MCG/ACT Aerosol, Nasal) Active. NexIUM (20MG  Capsule DR, Oral) Active. Famotidine (20MG  Tablet, Oral) Active.    Review of Systems General Not Present- Chills, Fatigue, Fever, Memory Loss, Night Sweats, Weight Gain and Weight Loss. Skin Not Present- Eczema, Hives, Itching, Lesions and Rash. HEENT Present- Blurred Vision, Hearing Loss and Tinnitus. Not Present- Dentures, Double Vision, Headache and Visual Loss. Respiratory Present- Allergies. Not Present- Chronic Cough, Coughing up blood, Shortness of breath at rest and Shortness of breath with exertion. Cardiovascular  Not Present- Chest Pain, Difficulty Breathing Lying Down, Murmur, Palpitations, Racing/skipping heartbeats and Swelling. Gastrointestinal  Not Present- Abdominal Pain, Bloody Stool, Constipation, Diarrhea, Difficulty Swallowing, Heartburn, Jaundice, Loss of appetitie, Nausea and Vomiting. Male Genitourinary Present- Incontinence. Not Present- Blood in Urine, Discharge, Flank Pain, Painful Urination, Urgency, Urinary frequency, Urinary Retention, Urinating at Night and Weak urinary stream. Musculoskeletal Present- Morning Stiffness. Not Present- Back Pain, Joint Pain, Joint Swelling, Muscle Pain, Muscle Weakness and Spasms. Neurological Not Present- Blackout spells, Difficulty with balance, Dizziness, Paralysis, Tremor and Weakness. Psychiatric Not Present- Insomnia.  Vitals  Weight: 190 lb Height: 66in Body Surface Area: 1.96 m Body Mass Index: 30.67 kg/m  Pulse: 80 (Regular)  BP: 148/78 (Sitting, Right Arm, Standard)    Physical Exam General Mental Status -Alert, cooperative and good historian. General Appearance-pleasant, Not in acute distress. Orientation-Oriented X3. Build & Nutrition-Well nourished and Well developed.  Head and Neck Head-normocephalic, atraumatic . Neck Global Assessment - supple, no bruit auscultated on the right, no bruit auscultated on the left.  Eye Pupil - Bilateral-Regular and Round. Motion - Bilateral-EOMI.  Chest and Lung Exam Auscultation Breath sounds - clear at anterior chest wall and clear at posterior chest wall. Adventitious sounds - No Adventitious sounds.  Cardiovascular Auscultation Rhythm - Regular rate and rhythm. Heart Sounds - S1 WNL and S2 WNL. Murmurs & Other Heart Sounds - Auscultation of the heart reveals - No Murmurs.  Abdomen Palpation/Percussion Tenderness - Abdomen is non-tender to palpation. Rigidity (guarding) - Abdomen is soft. Auscultation Auscultation of the abdomen reveals - Bowel sounds normal.  Male Genitourinary Note: Not done, not pertinent to present illness   Musculoskeletal Note: On exam, he is alert and oriented,  in no apparent distress. He walks with an antalgic gait pattern in the left side. His left is able to be flexed to 100 with no internal rotation, about 20 external rotation, 20 abduction. Right hip flexion 120, rotation in 30, out 40, abduction 40.  RADIOGRAPHS Reviewed. AP pelvis, lateral left hip. He has got bone-on-bone arthritis with subchondral cystic formation and osteophyte formation.   Assessment & Plan Primary osteoarthritis of left hip (M16.12)  Note:Surgical Plans: Left Total Hip Replacement - Anterior Approach  Disposition: Home  PCP: Dr. Chancy Milroy  Topical TXA  Anesthesia Issues: Did have an episode of "stopped breathing" after a previous scope procedure.  Patient was instructed on what medications to stop prior to surgery.  Signed electronically by Ok Edwards, III PA-C

## 2017-01-11 NOTE — Anesthesia Procedure Notes (Signed)
Procedure Name: Intubation Date/Time: 01/11/2017 8:31 AM Performed by: Cynda Familia Pre-anesthesia Checklist: Patient identified, Emergency Drugs available, Suction available and Patient being monitored Patient Re-evaluated:Patient Re-evaluated prior to inductionOxygen Delivery Method: Circle System Utilized Preoxygenation: Pre-oxygenation with 100% oxygen Intubation Type: IV induction Ventilation: Mask ventilation without difficulty Laryngoscope Size: Miller and 2 Grade View: Grade I Tube type: Oral Tube size: 7.0 mm Number of attempts: 1 Airway Equipment and Method: Stylet Placement Confirmation: ETT inserted through vocal cords under direct vision,  positive ETCO2 and breath sounds checked- equal and bilateral Secured at: 22 cm Tube secured with: Tape Dental Injury: Teeth and Oropharynx as per pre-operative assessment  Comments: Smooth RSI by Rose--  intubation AM CRNA atraumatic-- front tooth chipped prior to laryngoscopy--- teeth and mouth as preop--- bilat BS Rose

## 2017-01-11 NOTE — Progress Notes (Signed)
Portable AP Pelvis X-ray done. 

## 2017-01-11 NOTE — Progress Notes (Signed)
X-ray results noted 

## 2017-01-11 NOTE — Anesthesia Preprocedure Evaluation (Addendum)
Anesthesia Evaluation  Patient identified by MRN, date of birth, ID band Patient awake    Reviewed: Allergy & Precautions, NPO status , Patient's Chart, lab work & pertinent test results  Airway Mallampati: II  TM Distance: >3 FB Neck ROM: Full    Dental no notable dental hx. (+) Dental Advisory Given, Chipped   Pulmonary sleep apnea ,    Pulmonary exam normal breath sounds clear to auscultation       Cardiovascular negative cardio ROS Normal cardiovascular exam Rhythm:Regular Rate:Normal     Neuro/Psych negative neurological ROS  negative psych ROS   GI/Hepatic Neg liver ROS, GERD  Medicated,  Endo/Other  negative endocrine ROS  Renal/GU negative Renal ROS  negative genitourinary   Musculoskeletal negative musculoskeletal ROS (+)   Abdominal   Peds negative pediatric ROS (+)  Hematology negative hematology ROS (+)   Anesthesia Other Findings   Reproductive/Obstetrics negative OB ROS                            Anesthesia Physical Anesthesia Plan  ASA: II  Anesthesia Plan: Spinal   Post-op Pain Management:    Induction: Intravenous  Airway Management Planned: Simple Face Mask  Additional Equipment:   Intra-op Plan:   Post-operative Plan:   Informed Consent: I have reviewed the patients History and Physical, chart, labs and discussed the procedure including the risks, benefits and alternatives for the proposed anesthesia with the patient or authorized representative who has indicated his/her understanding and acceptance.   Dental advisory given  Plan Discussed with: CRNA and Surgeon  Anesthesia Plan Comments:         Anesthesia Quick Evaluation

## 2017-01-11 NOTE — Anesthesia Postprocedure Evaluation (Addendum)
Anesthesia Post Note  Patient: Benjamin Rivas  Procedure(s) Performed: Procedure(s) (LRB): LEFT TOTAL HIP ARTHROPLASTY ANTERIOR APPROACH (Left)  Patient location during evaluation: PACU Anesthesia Type: General Level of consciousness: awake and alert Pain management: pain level controlled Vital Signs Assessment: post-procedure vital signs reviewed and stable Respiratory status: spontaneous breathing, nonlabored ventilation, respiratory function stable and patient connected to nasal cannula oxygen Cardiovascular status: blood pressure returned to baseline and stable Postop Assessment: no signs of nausea or vomiting Anesthetic complications: no       Last Vitals:  Vitals:   01/11/17 1059 01/11/17 1100  BP:  (!) 153/78  Pulse: 93 94  Resp: (!) 21 14  Temp:      Last Pain:  Vitals:   01/11/17 1100  TempSrc:   PainSc: 5                  Jullianna Gabor S

## 2017-01-11 NOTE — Addendum Note (Signed)
Addendum  created 01/11/17 1327 by Myrtie Soman, MD   Visit Navigator Flowsheet section accepted

## 2017-01-11 NOTE — Interval H&P Note (Signed)
History and Physical Interval Note:  01/11/2017 7:55 AM  Benjamin Rivas Dec  has presented today for surgery, with the diagnosis of LEFT HIP OA  The various methods of treatment have been discussed with the patient and family. After consideration of risks, benefits and other options for treatment, the patient has consented to  Procedure(s): LEFT TOTAL HIP ARTHROPLASTY ANTERIOR APPROACH (Left) as a surgical intervention .  The patient's history has been reviewed, patient examined, no change in status, stable for surgery.  I have reviewed the patient's chart and labs.  Questions were answered to the patient's satisfaction.     [name]

## 2017-01-11 NOTE — Op Note (Signed)
OPERATIVE REPORT- TOTAL HIP ARTHROPLASTY   PREOPERATIVE DIAGNOSIS: Osteoarthritis of the Left hip.   POSTOPERATIVE DIAGNOSIS: Osteoarthritis of the Left  hip.   PROCEDURE: Left total hip arthroplasty, anterior approach.   SURGEON: Gaynelle Arabian, MD   ASSISTANT: Arlee Muslim, PA-C  ANESTHESIA:  General  ESTIMATED BLOOD LOSS:-450 ml   DRAINS: Hemovac x1.   COMPLICATIONS: None   CONDITION: PACU - hemodynamically stable.   BRIEF CLINICAL NOTE: Benjamin Rivas is a 71 y.o. male who has advanced end-  stage arthritis of their Left  hip with progressively worsening pain and  dysfunction.The patient has failed nonoperative management and presents for  total hip arthroplasty.   PROCEDURE IN DETAIL: After successful administration of spinal  anesthetic, the traction boots for the [facility] bed were placed on both  feet and the patient was placed onto the [facility] bed, boots placed into the leg  holders. The Left hip was then isolated from the perineum with plastic  drapes and prepped and draped in the usual sterile fashion. ASIS and  greater trochanter were marked and a oblique incision was made, starting  at about 1 cm lateral and 2 cm distal to the ASIS and coursing towards  the anterior cortex of the femur. The skin was cut with a 10 blade  through subcutaneous tissue to the level of the fascia overlying the  tensor fascia lata muscle. The fascia was then incised in line with the  incision at the junction of the anterior third and posterior 2/3rd. The  muscle was teased off the fascia and then the interval between the TFL  and the rectus was developed. The Hohmann retractor was then placed at  the top of the femoral neck over the capsule. The vessels overlying the  capsule were cauterized and the fat on top of the capsule was removed.  A Hohmann retractor was then placed anterior underneath the rectus  femoris to give exposure to the entire anterior capsule. A T-shaped  capsulotomy  was performed. The edges were tagged and the femoral head  was identified.       Osteophytes are removed off the superior acetabulum.  The femoral neck was then cut in situ with an oscillating saw. Traction  was then applied to the left lower extremity utilizing the [facility]  traction. The femoral head was then removed. Retractors were placed  around the acetabulum and then circumferential removal of the labrum was  performed. Osteophytes were also removed. Reaming starts at 47 mm to  medialize and  Increased in 2 mm increments to 51 mm. We reamed in  approximately 40 degrees of abduction, 20 degrees anteversion. A 52 mm  pinnacle acetabular shell was then impacted in anatomic position under  fluoroscopic guidance with excellent purchase. We did not need to place  any additional dome screws. A 32 mm neutral + 4 marathon liner was then  placed into the acetabular shell.       The femoral lift was then placed along the lateral aspect of the femur  just distal to the vastus ridge. The leg was  externally rotated and capsule  was stripped off the inferior aspect of the femoral neck down to the  level of the lesser trochanter, this was done with electrocautery. The femur was lifted after this was performed. The  leg was then placed in an extended and adducted position essentially delivering the femur. We also removed the capsule superiorly and the piriformis from the piriformis fossa  to gain excellent exposure of the  proximal femur. Rongeur was used to remove some cancellous bone to get  into the lateral portion of the proximal femur for placement of the  initial starter reamer. The starter broaches was placed  the starter broach  and was shown to go down the center of the canal. Broaching  with the  Corail system was then performed starting at size 8, coursing  Up to size 13. A size 13 had excellent torsional and rotational  and axial stability. The trial high offset neck was then placed  with a 32  + 5 trial head. The hip was then reduced. We confirmed that  the stem was in the canal both on AP and lateral x-rays. It also has excellent sizing. The hip was reduced with outstanding stability through full extension and full external rotation.. AP pelvis was taken and the leg lengths were measured and found to be equal. Hip was then dislocated again and the femoral head and neck removed. The  femoral broach was removed. Size 13 Corail stem with a high offset  neck was then impacted into the femur following native anteversion. Has  excellent purchase in the canal. Excellent torsional and rotational and  axial stability. It is confirmed to be in the canal on AP and lateral  fluoroscopic views. The 32 + 1 ceramic head was placed and the hip  reduced with outstanding stability. Again AP pelvis was taken and it  confirmed that the leg lengths were equal. The wound was then copiously  irrigated with saline solution and the capsule reattached and repaired  with Ethibond suture. 30 ml of .25% Bupivicaine was  injected into the capsule and into the edge of the tensor fascia lata as well as subcutaneous tissue. The fascia overlying the tensor fascia lata was then closed with a running #1 V-Loc. Subcu was closed with interrupted 2-0 Vicryl and subcuticular running 4-0 Monocryl. Incision was cleaned  and dried. Steri-Strips and a bulky sterile dressing applied. Hemovac  drain was hooked to suction and then the patient was awakened and transported to  recovery in stable condition.        Please note that a surgical assistant was a medical necessity for this procedure to perform it in a safe and expeditious manner. Assistant was necessary to provide appropriate retraction of vital neurovascular structures and to prevent femoral fracture and allow for anatomic placement of the prosthesis.  Gaynelle Arabian, M.D.

## 2017-01-11 NOTE — Transfer of Care (Signed)
Immediate Anesthesia Transfer of Care Note  Patient: Benjamin Rivas  Procedure(s) Performed: Procedure(s): LEFT TOTAL HIP ARTHROPLASTY ANTERIOR APPROACH (Left)  Patient Location: PACU  Anesthesia Type:General  Level of Consciousness: sedated  Airway & Oxygen Therapy: Patient Spontanous Breathing and Patient connected to face mask oxygen  Post-op Assessment: Report given to RN and Post -op Vital signs reviewed and stable  Post vital signs: Reviewed and stable  Last Vitals:  Vitals:   01/11/17 0612  BP: (!) 159/93  Pulse: 92  Resp: 18  Temp: 37.1 C    Last Pain:  Vitals:   01/11/17 0625  TempSrc:   PainSc: 1       Patients Stated Pain Goal: 3 ([id number] [phone])  Complications: No apparent anesthesia complications

## 2017-01-12 LAB — CBC
HEMATOCRIT: 35.7 % — AB (ref 39.0–52.0)
Hemoglobin: 12 g/dL — ABNORMAL LOW (ref 13.0–17.0)
MCH: 31.2 pg (ref 26.0–34.0)
MCHC: 33.6 g/dL (ref 30.0–36.0)
MCV: 92.7 fL (ref 78.0–100.0)
PLATELETS: 219 10*3/uL (ref 150–400)
RBC: 3.85 MIL/uL — ABNORMAL LOW (ref 4.22–5.81)
RDW: 13.8 % (ref 11.5–15.5)
WBC: 13.7 10*3/uL — AB (ref 4.0–10.5)

## 2017-01-12 LAB — BASIC METABOLIC PANEL
ANION GAP: 8 (ref 5–15)
BUN: 13 mg/dL (ref 6–20)
CALCIUM: 8.5 mg/dL — AB (ref 8.9–10.3)
CO2: 23 mmol/L (ref 22–32)
Chloride: 105 mmol/L (ref 101–111)
Creatinine, Ser: 1.2 mg/dL (ref 0.61–1.24)
GFR, EST NON AFRICAN AMERICAN: 60 mL/min — AB (ref >60)
Glucose, Bld: 143 mg/dL — ABNORMAL HIGH (ref 65–99)
Potassium: 4.5 mmol/L (ref 3.5–5.1)
Sodium: 136 mmol/L (ref 135–145)

## 2017-01-12 MED ORDER — TRAMADOL HCL 50 MG PO TABS: 50.0000 mg | ORAL_TABLET | Freq: Four times a day (QID) | ORAL | 0 refills | Status: DC | PRN

## 2017-01-12 MED ORDER — RIVAROXABAN 10 MG PO TABS: 10.0000 mg | ORAL_TABLET | Freq: Every day | ORAL | 0 refills | Status: DC

## 2017-01-12 MED ORDER — METHOCARBAMOL 500 MG PO TABS: 500.0000 mg | ORAL_TABLET | Freq: Four times a day (QID) | ORAL | 0 refills | Status: DC | PRN

## 2017-01-12 MED ORDER — OXYCODONE HCL 5 MG PO TABS: 5.0000 mg | ORAL_TABLET | ORAL | 0 refills | Status: DC | PRN

## 2017-01-12 NOTE — Progress Notes (Signed)
   Subjective: 1 Day Post-Op Procedure(s) (LRB): LEFT TOTAL HIP ARTHROPLASTY ANTERIOR APPROACH (Left) Patient reports pain as mild.   Patient seen in rounds by Dr. Wynelle Link. Patient is well, but has had some minor complaints of pain in the hip, requiring pain medications We will start therapy today.  If they do well with therapy and meets all goals, then will allow home later this afternoon following therapy.  If needs more therapy or pain issues, then possibly tomorrow. Plan is to go Home after hospital stay.  Objective: Vital signs in last 24 hours: Temp:  [97.4 F (36.3 C)-100.5 F (38.1 C)] 98 F (36.7 C) (02/08 0513) Pulse Rate:  [81-118] 81 (02/08 0513) Resp:  [14-24] 20 (02/08 0513) BP: (121-169)/(66-88) 125/75 (02/08 0513) SpO2:  [92 %-100 %] 97 % (02/08 0513) Weight:  [86.6 kg (191 lb)] 86.6 kg (191 lb) (02/07 1205)  Intake/Output from previous day:  Intake/Output Summary (Last 24 hours) at 01/12/17 0903 Last data filed at 01/12/17 0800  Gross per 24 hour  Intake             3575 ml  Output             3115 ml  Net              460 ml    Intake/Output this shift: Total I/O In: 60 [P.O.:60] Out: -   Labs:  Recent Labs  01/12/17 0426  HGB 12.0*    Recent Labs  01/12/17 0426  WBC 13.7*  RBC 3.85*  HCT 35.7*  PLT 219    Recent Labs  01/12/17 0426  NA 136  K 4.5  CL 105  CO2 23  BUN 13  CREATININE 1.20  GLUCOSE 143*  CALCIUM 8.5*   No results for input(s): LABPT, INR in the last 72 hours.  EXAM General - Patient is Alert, Appropriate and Oriented Extremity - Neurovascular intact Sensation intact distally Intact pulses distally Dorsiflexion/Plantar flexion intact Dressing - dressing C/D/I Motor Function - intact, moving foot and toes well on exam.  Hemovac pulled without difficulty.  Past Medical History:  Diagnosis Date  . Arthritis    L knee  . Bone spur    left hip  . Cancer [facility])    prostate cancer  . Complication of  anesthesia    "stopped breathing with knee surgery-with following prostate surgery"  . ED (erectile dysfunction)   . GERD (gastroesophageal reflux disease)   . High cholesterol   . Ringing in ears   . Seasonal allergies   . Sleep apnea    cpap    Assessment/Plan: 1 Day Post-Op Procedure(s) (LRB): LEFT TOTAL HIP ARTHROPLASTY ANTERIOR APPROACH (Left) Principal Problem:   OA (osteoarthritis) of hip  Estimated body mass index is 30.37 kg/m as calculated from the following:   Height as of this encounter: 5' 6.5" (1.689 m).   Weight as of this encounter: 86.6 kg (191 lb). Discharge home with home health if does well  DVT Prophylaxis - Xarelto Weight Bearing As Tolerated left Leg Hemovac Pulled Begin Therapy  If meets goals and able to go home: Discharge home with home health if meets goals Diet - Cardiac diet Follow up - in 2 weeks Activity - WBAT Disposition - Home Condition Upon Discharge - pending D/C Meds - See DC Summary DVT Prophylaxis - [place], PA-C Orthopaedic Surgery 01/12/2017, 9:03 AM

## 2017-01-12 NOTE — Progress Notes (Signed)
Physical Therapy Treatment Patient Details Name: HOPPER NORMANN MRN: [id number] DOB: 03-05-1946 Today's Date: 01/12/2017    History of Present Illness Pt s/p L THR    PT Comments    Pt progressing well with mobility and eager for dc home.  Reviewed stairs and car transfers with pt and spouse.  Follow Up Recommendations  Home health PT     Equipment Recommendations  Rolling walker with 5" wheels    Recommendations for Other Services OT consult     Precautions / Restrictions Precautions Precautions: Fall Restrictions Weight Bearing Restrictions: No Other Position/Activity Restrictions: WBAT    Mobility  Bed Mobility               General bed mobility comments: Pt OOB and requests back to recliner  Transfers Overall transfer level: Needs assistance Equipment used: Rolling walker (2 wheeled) Transfers: Sit to/from Stand Sit to Stand: Supervision         General transfer comment: min cues for LE management and use of UEs to self assist  Ambulation/Gait Ambulation/Gait assistance: Min guard;Supervision Ambulation Distance (Feet): 300 Feet Assistive device: Rolling walker (2 wheeled) Gait Pattern/deviations: Step-through pattern;Decreased step length - right;Decreased step length - left;Shuffle;Trunk flexed Gait velocity: decr Gait velocity interpretation: Below normal speed for age/gender General Gait Details: cues for posture and position from RW   Stairs Stairs: Yes   Stair Management: One rail Right;Step to pattern;Forwards;With cane Number of Stairs: 4 General stair comments: cues for sequence and foot/QC placement.  Spouse present and assisting  Wheelchair Mobility    Modified Rankin (Stroke Patients Only)       Balance Overall balance assessment: No apparent balance deficits (not formally assessed)                                  Cognition Arousal/Alertness: Awake/alert Behavior During Therapy: WFL for tasks  assessed/performed Overall Cognitive Status: Within Functional Limits for tasks assessed                      Exercises Total Joint Exercises Ankle Circles/Pumps: AROM;Both;15 reps;Supine Quad Sets: AROM;Both;10 reps;Supine Heel Slides: AAROM;Left;20 reps;Supine Hip ABduction/ADduction: AAROM;Left;15 reps;Supine    General Comments        Pertinent Vitals/Pain Pain Assessment: 0-10 Pain Score: 3  Pain Location: L hip Pain Descriptors / Indicators: Aching;Sore Pain Intervention(s): Limited activity within patient's tolerance;Monitored during session    Home Living                      Prior Function            PT Goals (current goals can now be found in the care plan section) Acute Rehab PT Goals Patient Stated Goal: get back to playing pickle ball PT Goal Formulation: With patient Time For Goal Achievement: 01/14/17 Potential to Achieve Goals: Good Progress towards PT goals: Progressing toward goals    Frequency    7X/week      PT Plan Current plan remains appropriate    Co-evaluation             End of Session Equipment Utilized During Treatment: Gait belt Activity Tolerance: Patient tolerated treatment well Patient left: in chair;with call bell/phone within reach;with family/visitor present     Time: [id number] PT Time Calculation (min) (ACUTE ONLY): 23 min  Charges:  $Gait Training: 8-22 mins $Therapeutic Exercise: 8-22 mins $Therapeutic Activity: 8-22 mins  G Codes:      Jeannett Dekoning February 01, 2017, 1:33 PM

## 2017-01-12 NOTE — Evaluation (Signed)
Occupational Therapy Evaluation and Discharge Patient Details Name: Benjamin Rivas MRN: [id number] DOB: 1946-02-14 Today's Date: 01/12/2017    History of Present Illness Pt s/p L THR   Clinical Impression   This 71 yo male admitted and underwent above presents to acute OT with all education completed, we will D/C from acute OT,    Follow Up Recommendations  No OT follow up;Supervision/Assistance - 24 hour    Equipment Recommendations  None recommended by OT       Precautions / Restrictions Precautions Precautions: Fall Restrictions Weight Bearing Restrictions: No Other Position/Activity Restrictions: WBAT      Mobility Bed Mobility               General bed mobility comments: Pt up in recliner upon arrival  Transfers Overall transfer level: Needs assistance Equipment used: Rolling walker (2 wheeled) Transfers: Sit to/from Stand Sit to Stand: Supervision                   ADL Overall ADL's : Needs assistance/impaired Eating/Feeding: Independent;Sitting   Grooming: Set up;Supervision/safety;Standing   Upper Body Bathing: Set up;Sitting   Lower Body Bathing: Moderate assistance (S sit<>stand)   Upper Body Dressing : Set up;Sitting   Lower Body Dressing: Moderate assistance (S sit<>stand)   Toilet Transfer: Supervision/safety;Ambulation;RW Toilet Transfer Details (indicate cue type and reason): recliner>shower stall>recliner Toileting- Clothing Manipulation and Hygiene: Supervision/safety;Sit to/from stand   Tub/ Shower Transfer: Walk-in shower;Min guard;Ambulation;Rolling walker                     Pertinent Vitals/Pain Pain Assessment: 0-10 Pain Score: 2  Pain Location: L hip Pain Descriptors / Indicators: Aching;Sore Pain Intervention(s): Limited activity within patient's tolerance;Repositioned;Premedicated before session;Ice applied     Hand Dominance Right   Extremity/Trunk Assessment Upper Extremity Assessment Upper  Extremity Assessment: Overall WFL for tasks assessed           Communication Communication Communication: No difficulties   Cognition Arousal/Alertness: Awake/alert Behavior During Therapy: WFL for tasks assessed/performed Overall Cognitive Status: Within Functional Limits for tasks assessed                                Home Living Family/patient expects to be discharged to:: Private residence Living Arrangements: Spouse/significant other Available Help at Discharge: Family Type of Home: House Home Access: Stairs to enter Technical brewer of Steps: 3 Entrance Stairs-Rails: Right;Left Home Layout: Able to live on main level with bedroom/bathroom     Bathroom Shower/Tub: Walk-in shower;Door   Bathroom Toilet: Handicapped height     Home Equipment: Walker - standard;Shower seat;Bedside commode          Prior Functioning/Environment Level of Independence: Independent                 OT Problem List: Decreased range of motion;Pain   OT Treatment/Interventions:      OT Goals(Current goals can be found in the care plan section) Acute Rehab OT Goals Patient Stated Goal: get back to playing pickle ball  OT Frequency:      End of Session Equipment Utilized During Treatment: Rolling walker  Activity Tolerance: Patient tolerated treatment well Patient left: in chair;with call bell/phone within reach   Time: [id number] OT Time Calculation (min): 20 min Charges:  OT General Charges $OT Visit: 1 Procedure OT Evaluation $OT Eval Moderate Complexity: 1 Procedure  Almon Register [id number] 01/12/2017, 9:03 AM

## 2017-01-12 NOTE — Progress Notes (Signed)
Physical Therapy Treatment Patient Details Name: Benjamin Rivas MRN: [id number] DOB: 1946/07/07 Today's Date: 01/12/2017    History of Present Illness Pt s/p L THR    PT Comments    Pt progressing well with mobility and eager for dc home this pm.  Follow Up Recommendations  Home health PT     Equipment Recommendations  Rolling walker with 5" wheels    Recommendations for Other Services OT consult     Precautions / Restrictions Precautions Precautions: Fall Restrictions Weight Bearing Restrictions: No Other Position/Activity Restrictions: WBAT    Mobility  Bed Mobility               General bed mobility comments: Pt OOB and requests back to recliner  Transfers Overall transfer level: Needs assistance Equipment used: Rolling walker (2 wheeled) Transfers: Sit to/from Stand Sit to Stand: Supervision         General transfer comment: cues for LE management and use of UEs to self assist  Ambulation/Gait Ambulation/Gait assistance: Min guard Ambulation Distance (Feet): 240 Feet Assistive device: Rolling walker (2 wheeled) Gait Pattern/deviations: Step-through pattern;Decreased step length - right;Decreased step length - left;Shuffle;Trunk flexed Gait velocity: decr Gait velocity interpretation: Below normal speed for age/gender General Gait Details: cues for posture and position from RW   Stairs            Wheelchair Mobility    Modified Rankin (Stroke Patients Only)       Balance                                    Cognition Arousal/Alertness: Awake/alert Behavior During Therapy: WFL for tasks assessed/performed Overall Cognitive Status: Within Functional Limits for tasks assessed                      Exercises Total Joint Exercises Ankle Circles/Pumps: AROM;Both;15 reps;Supine Quad Sets: AROM;Both;10 reps;Supine Heel Slides: AAROM;Left;20 reps;Supine Hip ABduction/ADduction: AAROM;Left;15 reps;Supine    General  Comments        Pertinent Vitals/Pain Pain Assessment: 0-10 Pain Score: 2  Pain Location: L hip Pain Descriptors / Indicators: Aching;Sore Pain Intervention(s): Limited activity within patient's tolerance;Monitored during session;Premedicated before session;Ice applied    Home Living Family/patient expects to be discharged to:: Private residence Living Arrangements: Spouse/significant other Available Help at Discharge: Family Type of Home: House Home Access: Stairs to enter Entrance Stairs-Rails: Right;Left Home Layout: Able to live on main level with bedroom/bathroom Home Equipment: Walker - standard;Shower seat;Bedside commode      Prior Function Level of Independence: Independent          PT Goals (current goals can now be found in the care plan section) Acute Rehab PT Goals Patient Stated Goal: get back to playing pickle ball PT Goal Formulation: With patient Time For Goal Achievement: 01/14/17 Potential to Achieve Goals: Good Progress towards PT goals: Progressing toward goals    Frequency    7X/week      PT Plan Current plan remains appropriate    Co-evaluation             End of Session Equipment Utilized During Treatment: Gait belt Activity Tolerance: Patient tolerated treatment well Patient left: in chair;with call bell/phone within reach;with family/visitor present     Time: [id number] PT Time Calculation (min) (ACUTE ONLY): 30 min  Charges:  $Gait Training: 8-22 mins $Therapeutic Exercise: 8-22 mins  G Codes:      Ethelreda Sukhu 2017-01-29, 12:34 PM

## 2017-01-12 NOTE — Discharge Summary (Signed)
Physician Discharge Summary   Patient ID: Benjamin Rivas MRN: [id number] DOB/AGE: Apr 30, 1946 71 y.o.  Admit date: 01/11/2017 Discharge date: 01-12-2017  Primary Diagnosis:  Osteoarthritis of the Left hip.   Admission Diagnoses:  Past Medical History:  Diagnosis Date  . Arthritis    L knee  . Bone spur    left hip  . Cancer [facility])    prostate cancer  . Complication of anesthesia    "stopped breathing with knee surgery-with following prostate surgery"  . ED (erectile dysfunction)   . GERD (gastroesophageal reflux disease)   . High cholesterol   . Ringing in ears   . Seasonal allergies   . Sleep apnea    cpap   Discharge Diagnoses:   Principal Problem:   OA (osteoarthritis) of hip  Estimated body mass index is 30.37 kg/m as calculated from the following:   Height as of this encounter: 5' 6.5" (1.689 m).   Weight as of this encounter: 86.6 kg (191 lb).  Procedure(s) (LRB): LEFT TOTAL HIP ARTHROPLASTY ANTERIOR APPROACH (Left)   Consults: None  HPI: Benjamin Rivas is a 71 y.o. male who has advanced end-  stage arthritis of their Left  hip with progressively worsening pain and  dysfunction.The patient has failed nonoperative management and presents for  total hip arthroplasty.   Laboratory Data: Admission on 01/11/2017  Component Date Value Ref Range Status  . WBC 01/12/2017 13.7* 4.0 - 10.5 K/uL Final  . RBC 01/12/2017 3.85* 4.22 - 5.81 MIL/uL Final  . Hemoglobin 01/12/2017 12.0* 13.0 - 17.0 g/dL Final  . HCT 01/12/2017 35.7* 39.0 - 52.0 % Final  . MCV 01/12/2017 92.7  78.0 - 100.0 fL Final  . MCH 01/12/2017 31.2  26.0 - 34.0 pg Final  . MCHC 01/12/2017 33.6  30.0 - 36.0 g/dL Final  . RDW 01/12/2017 13.8  11.5 - 15.5 % Final  . Platelets 01/12/2017 219  150 - 400 K/uL Final  . Sodium 01/12/2017 136  135 - 145 mmol/L Final  . Potassium 01/12/2017 4.5  3.5 - 5.1 mmol/L Final  . Chloride 01/12/2017 105  101 - 111 mmol/L Final  . CO2 01/12/2017 23  22 - 32 mmol/L Final    . Glucose, Bld 01/12/2017 143* 65 - 99 mg/dL Final  . BUN 01/12/2017 13  6 - 20 mg/dL Final  . Creatinine, Ser 01/12/2017 1.20  0.61 - 1.24 mg/dL Final  . Calcium 01/12/2017 8.5* 8.9 - 10.3 mg/dL Final  . GFR calc non Af Amer 01/12/2017 60* >60 mL/min Final  . GFR calc Af Amer 01/12/2017 >60  >60 mL/min Final   Comment: (NOTE) The eGFR has been calculated using the CKD EPI equation. This calculation has not been validated in all clinical situations. eGFR's persistently <60 mL/min signify possible Chronic Kidney Disease.   Georgiann Hahn gap 01/12/2017 8  5 - 15 Final  Hospital Outpatient Visit on 01/05/2017  Component Date Value Ref Range Status  . aPTT 01/05/2017 29  24 - 36 seconds Final  . WBC 01/05/2017 9.1  4.0 - 10.5 K/uL Final  . RBC 01/05/2017 5.17  4.22 - 5.81 MIL/uL Final  . Hemoglobin 01/05/2017 16.0  13.0 - 17.0 g/dL Final  . HCT 01/05/2017 48.0  39.0 - 52.0 % Final  . MCV 01/05/2017 92.8  78.0 - 100.0 fL Final  . MCH 01/05/2017 30.9  26.0 - 34.0 pg Final  . MCHC 01/05/2017 33.3  30.0 - 36.0 g/dL Final  . RDW 01/05/2017 13.5  11.5 - 15.5 % Final  . Platelets 01/05/2017 257  150 - 400 K/uL Final  . Sodium 01/05/2017 138  135 - 145 mmol/L Final  . Potassium 01/05/2017 4.4  3.5 - 5.1 mmol/L Final  . Chloride 01/05/2017 103  101 - 111 mmol/L Final  . CO2 01/05/2017 27  22 - 32 mmol/L Final  . Glucose, Bld 01/05/2017 109* 65 - 99 mg/dL Final  . BUN 01/05/2017 12  6 - 20 mg/dL Final  . Creatinine, Ser 01/05/2017 1.26* 0.61 - 1.24 mg/dL Final  . Calcium 01/05/2017 9.4  8.9 - 10.3 mg/dL Final  . Total Protein 01/05/2017 7.9  6.5 - 8.1 g/dL Final  . Albumin 01/05/2017 4.4  3.5 - 5.0 g/dL Final  . AST 01/05/2017 24  15 - 41 U/L Final  . ALT 01/05/2017 21  17 - 63 U/L Final  . Alkaline Phosphatase 01/05/2017 80  38 - 126 U/L Final  . Total Bilirubin 01/05/2017 1.1  0.3 - 1.2 mg/dL Final  . GFR calc non Af Amer 01/05/2017 57* >60 mL/min Final  . GFR calc Af Amer 01/05/2017 >60  >60  mL/min Final   Comment: (NOTE) The eGFR has been calculated using the CKD EPI equation. This calculation has not been validated in all clinical situations. eGFR's persistently <60 mL/min signify possible Chronic Kidney Disease.   . Anion gap 01/05/2017 8  5 - 15 Final  . Prothrombin Time 01/05/2017 12.7  11.4 - 15.2 seconds Final  . INR 01/05/2017 0.95   Final  . ABO/RH(D) 01/05/2017 A POS   Final  . Antibody Screen 01/05/2017 NEG   Final  . Sample Expiration 01/05/2017 01/14/2017   Final  . Extend sample reason 01/05/2017 NO TRANSFUSIONS OR PREGNANCY IN THE PAST 3 MONTHS   Final  . MRSA, PCR 01/05/2017 NEGATIVE  NEGATIVE Final  . Staphylococcus aureus 01/05/2017 NEGATIVE  NEGATIVE Final   Comment:        The Xpert SA Assay (FDA approved for NASAL specimens in patients over 39 years of age), is one component of a comprehensive surveillance program.  Test performance has been validated by [facility] for patients greater than or equal to 10 year old. It is not intended to diagnose infection nor to guide or monitor treatment.      X-Rays:Dg Pelvis Portable  Result Date: 01/11/2017 CLINICAL DATA:  Postop left total hip replacement. EXAM: PORTABLE PELVIS 1-2 VIEWS COMPARISON:  None. FINDINGS: Left total hip arthroplasty with subcutaneous/joint air. Femoral stem appears well seated. Mild degenerative changes in the right hip and visualized portion of the spine. IMPRESSION: 1. Left hip arthroplasty with expected postoperative findings. 2. Right hip osteoarthritis. Electronically Signed   By: Lorin Picket M.D.   On: 01/11/2017 11:02   Dg C-arm 1-60 Min-no Report  Result Date: 01/11/2017 Fluoroscopy was utilized by the requesting physician.  No radiographic interpretation.    EKG: Orders placed or performed during the hospital encounter of 04/07/15  . EKG 12-Lead  . EKG 12-Lead     Hospital Course: Patient was admitted to [facility] and taken to the OR and underwent  the above state procedure without complications.  Patient tolerated the procedure well and was later transferred to the recovery room and then to the orthopaedic floor for postoperative care.  They were given PO and IV analgesics for pain control following their surgery.  They were given 24 hours of postoperative antibiotics of  Anti-infectives    Start  Dose/Rate Route Frequency Ordered Stop   01/11/17 1500  ceFAZolin (ANCEF) IVPB 2g/100 mL premix     2 g 200 mL/hr over 30 Minutes Intravenous Every 6 hours 01/11/17 1212 01/11/17 2158   01/11/17 0608  ceFAZolin (ANCEF) IVPB 2g/100 mL premix     2 g 200 mL/hr over 30 Minutes Intravenous On call to O.R. 01/11/17 [id number] 01/11/17 0834     and started on DVT prophylaxis in the form of Xarelto.   PT and OT were ordered for total hip protocol.  The patient was allowed to be WBAT with therapy. Discharge planning was consulted to help with postop disposition and equipment needs.  Patient had a decnt night on the evening of surgery.  They started to get up OOB with therapy on day one.  Hemovac drain was pulled without difficulty.  Continued to work with therapy into day two.  Dressing was changed on day two and the incision was .  By day three, the patient had progressed with therapy and meeting their goals.  Incision was healing well.  Patient was seen in rounds and was ready to go home.   Diet - Cardiac diet Follow up - in 2 weeks Activity - WBAT Disposition - Home Condition Upon Discharge - improved D/C Meds - See DC Summary DVT Prophylaxis - Xarelto  Discharge Instructions    Call MD / Call 911    Complete by:  As directed    If you experience chest pain or shortness of breath, CALL 911 and be transported to the hospital emergency room.  If you develope a fever above 101 F, pus (white drainage) or increased drainage or redness at the wound, or calf pain, call your surgeon's office.   Change dressing    Complete by:  As directed    You may  change your dressing dressing daily with sterile 4 x 4 inch gauze dressing and paper tape.  Do not submerge the incision under water.   Constipation Prevention    Complete by:  As directed    Drink plenty of fluids.  Prune juice may be helpful.  You may use a stool softener, such as Colace (over the counter) 100 mg twice a day.  Use MiraLax (over the counter) for constipation as needed.   Diet - low sodium heart healthy    Complete by:  As directed    Discharge instructions    Complete by:  As directed    Pick up stool softner and laxative for home use following surgery while on pain medications. Do not submerge incision under water. Please use good hand washing techniques while changing dressing each day. May shower starting three days after surgery. Please use a clean towel to pat the incision dry following showers. Continue to use ice for pain and swelling after surgery. Do not use any lotions or creams on the incision until instructed by your surgeon.  Wear both TED hose on both legs during the day every day for three weeks, but may have off at night at home.  Postoperative Constipation Protocol  Constipation - defined medically as fewer than three stools per week and severe constipation as less than one stool per week.  One of the most common issues patients have following surgery is constipation.  Even if you have a regular bowel pattern at home, your normal regimen is likely to be disrupted due to multiple reasons following surgery.  Combination of anesthesia, postoperative narcotics, change in appetite and fluid intake all  can affect your bowels.  In order to avoid complications following surgery, here are some recommendations in order to help you during your recovery period.  Colace (docusate) - Pick up an over-the-counter form of Colace or another stool softener and take twice a day as long as you are requiring postoperative pain medications.  Take with a full glass of water daily.   If you experience loose stools or diarrhea, hold the colace until you stool forms back up.  If your symptoms do not get better within 1 week or if they get worse, check with your doctor.  Dulcolax (bisacodyl) - Pick up over-the-counter and take as directed by the product packaging as needed to assist with the movement of your bowels.  Take with a full glass of water.  Use this product as needed if not relieved by Colace only.   MiraLax (polyethylene glycol) - Pick up over-the-counter to have on hand.  MiraLax is a solution that will increase the amount of water in your bowels to assist with bowel movements.  Take as directed and can mix with a glass of water, juice, soda, coffee, or tea.  Take if you go more than two days without a movement. Do not use MiraLax more than once per day. Call your doctor if you are still constipated or irregular after using this medication for 7 days in a row.  If you continue to have problems with postoperative constipation, please contact the office for further assistance and recommendations.  If you experience "the worst abdominal pain ever" or develop nausea or vomiting, please contact the office immediatly for further recommendations for treatment.   Take Xarelto for two and a half more weeks, then discontinue Xarelto. Once the patient has completed the blood thinner regimen, then take a Baby 81 mg Aspirin daily for three more weeks.   Do not sit on low chairs, stoools or toilet seats, as it may be difficult to get up from low surfaces    Complete by:  As directed    Driving restrictions    Complete by:  As directed    No driving until released by the physician.   Increase activity slowly as tolerated    Complete by:  As directed    Lifting restrictions    Complete by:  As directed    No lifting until released by the physician.   Patient may shower    Complete by:  As directed    You may shower without a dressing once there is no drainage.  Do not wash over  the wound.  If drainage remains, do not shower until drainage stops.   TED hose    Complete by:  As directed    Use stockings (TED hose) for 3 weeks on both leg(s).  You may remove them at night for sleeping.   Weight bearing as tolerated    Complete by:  As directed    Laterality:  left   Extremity:  Lower     Allergies as of 01/12/2017   No Known Allergies     Medication List    TAKE these medications   esomeprazole 20 MG capsule Commonly known as:  NEXIUM Take 20 mg by mouth every morning.   famotidine 20 MG tablet Commonly known as:  PEPCID Take 20 mg by mouth daily as needed for heartburn or indigestion.   Melatonin 5 MG Tabs Take 5 mg by mouth at bedtime.   methocarbamol 500 MG tablet Commonly known as:  ROBAXIN Take 1 tablet (500 mg total) by mouth every 6 (six) hours as needed for muscle spasms.   montelukast 10 MG tablet Commonly known as:  SINGULAIR Take 10 mg by mouth every morning.   NASACORT AQ NA Place 1 spray into the nose daily.   oxyCODONE 5 MG immediate release tablet Commonly known as:  Oxy IR/ROXICODONE Take 1-2 tablets (5-10 mg total) by mouth every 4 (four) hours as needed for moderate pain or severe pain.   REFRESH OP Apply 1 drop to eye daily as needed (dry eyes).   rivaroxaban 10 MG Tabs tablet Commonly known as:  XARELTO Take 1 tablet (10 mg total) by mouth daily with breakfast. Take Xarelto for two and a half more weeks following discharge from the hospital, then discontinue Xarelto. Once the patient has completed the blood thinner regimen, then take a Baby 81 mg Aspirin daily for three more weeks. Start taking on:  01/13/2017   traMADol 50 MG tablet Commonly known as:  ULTRAM Take 1-2 tablets (50-100 mg total) by mouth every 6 (six) hours as needed for moderate pain.      Follow-up Information    [name], MD. Schedule an appointment as soon as possible for a visit on 01/24/2017.   Specialty:  Orthopedic Surgery Contact  information: [address] [place] [address] [phone]           Signed: Arlee Muslim, PA-C Orthopaedic Surgery 01/12/2017, 9:22 AM

## 2017-01-12 NOTE — Discharge Instructions (Addendum)
° °Dr. Frank Aluisio °Total Joint Specialist °[facility] °[address] °[place], [place] [address] °[phone] ° °ANTERIOR APPROACH TOTAL HIP REPLACEMENT POSTOPERATIVE DIRECTIONS ° ° °Hip Rehabilitation, Guidelines Following Surgery  °The results of a hip operation are greatly improved after range of motion and muscle strengthening exercises. Follow all safety measures which are given to protect your hip. If any of these exercises cause increased pain or swelling in your joint, decrease the amount until you are comfortable again. Then slowly increase the exercises. Call your caregiver if you have problems or questions.  ° °HOME CARE INSTRUCTIONS  °Remove items at home which could result in a fall. This includes throw rugs or furniture in walking pathways.  °· ICE to the affected hip every three hours for 30 minutes at a time and then as needed for pain and swelling.  Continue to use ice on the hip for pain and swelling from surgery. You may notice swelling that will progress down to the foot and ankle.  This is normal after surgery.  Elevate the leg when you are not up walking on it.   °· Continue to use the breathing machine which will help keep your temperature down.  It is common for your temperature to cycle up and down following surgery, especially at night when you are not up moving around and exerting yourself.  The breathing machine keeps your lungs expanded and your temperature down. ° ° °DIET °You may resume your previous home diet once your are discharged from the hospital. ° °DRESSING / WOUND CARE / SHOWERING °You may shower 3 days after surgery, but keep the wounds dry during showering.  You may use an occlusive plastic wrap (Press'n Seal for example), NO SOAKING/SUBMERGING IN THE BATHTUB.  If the bandage gets wet, change with a clean dry gauze.  If the incision gets wet, pat the wound dry with a clean towel. °You may start showering once you are discharged home but do not  submerge the incision under water. Just pat the incision dry and apply a dry gauze dressing on daily. °Change the surgical dressing daily and reapply a dry dressing each time. ° °ACTIVITY °Walk with your walker as instructed. °Use walker as long as suggested by your caregivers. °Avoid periods of inactivity such as sitting longer than an hour when not asleep. This helps prevent blood clots.  °You may resume a sexual relationship in one month or when given the OK by your doctor.  °You may return to work once you are cleared by your doctor.  °Do not drive a car for 6 weeks or until released by you surgeon.  °Do not drive while taking narcotics. ° °WEIGHT BEARING °Weight bearing as tolerated with assist device (walker, cane, etc) as directed, use it as long as suggested by your surgeon or therapist, typically at least 4-6 weeks. ° °POSTOPERATIVE CONSTIPATION PROTOCOL °Constipation - defined medically as fewer than three stools per week and severe constipation as less than one stool per week. ° °One of the most common issues patients have following surgery is constipation.  Even if you have a regular bowel pattern at home, your normal regimen is likely to be disrupted due to multiple reasons following surgery.  Combination of anesthesia, postoperative narcotics, change in appetite and fluid intake all can affect your bowels.  In order to avoid complications following surgery, here are some recommendations in order to help you during your recovery period. ° °Colace (docusate) - Pick up an over-the-counter   form of Colace or another stool softener and take twice a day as long as you are requiring postoperative pain medications.  Take with a full glass of water daily.  If you experience loose stools or diarrhea, hold the colace until you stool forms back up.  If your symptoms do not get better within 1 week or if they get worse, check with your doctor. ° °Dulcolax (bisacodyl) - Pick up over-the-counter and take as directed  by the product packaging as needed to assist with the movement of your bowels.  Take with a full glass of water.  Use this product as needed if not relieved by Colace only.  ° °MiraLax (polyethylene glycol) - Pick up over-the-counter to have on hand.  MiraLax is a solution that will increase the amount of water in your bowels to assist with bowel movements.  Take as directed and can mix with a glass of water, juice, soda, coffee, or tea.  Take if you go more than two days without a movement. °Do not use MiraLax more than once per day. Call your doctor if you are still constipated or irregular after using this medication for 7 days in a row. ° °If you continue to have problems with postoperative constipation, please contact the office for further assistance and recommendations.  If you experience "the worst abdominal pain ever" or develop nausea or vomiting, please contact the office immediatly for further recommendations for treatment. ° °ITCHING ° If you experience itching with your medications, try taking only a single pain pill, or even half a pain pill at a time.  You can also use Benadryl over the counter for itching or also to help with sleep.  ° °TED HOSE STOCKINGS °Wear the elastic stockings on both legs for three weeks following surgery during the day but you may remove then at night for sleeping. ° °MEDICATIONS °See your medication summary on the “After Visit Summary” that the nursing staff will review with you prior to discharge.  You may have some home medications which will be placed on hold until you complete the course of blood thinner medication.  It is important for you to complete the blood thinner medication as prescribed by your surgeon.  Continue your approved medications as instructed at time of discharge. ° °PRECAUTIONS °If you experience chest pain or shortness of breath - call 911 immediately for transfer to the hospital emergency department.  °If you develop a fever greater that 101 F,  purulent drainage from wound, increased redness or drainage from wound, foul odor from the wound/dressing, or calf pain - CONTACT YOUR SURGEON.   °                                                °FOLLOW-UP APPOINTMENTS °Make sure you keep all of your appointments after your operation with your surgeon and caregivers. You should call the office at the above phone number and make an appointment for approximately two weeks after the date of your surgery or on the date instructed by your surgeon outlined in the "After Visit Summary". ° °RANGE OF MOTION AND STRENGTHENING EXERCISES  °These exercises are designed to help you keep full movement of your hip joint. Follow your caregiver's or physical therapist's instructions. Perform all exercises about fifteen times, three times per day or as directed. Exercise both hips, even if you   have had only one joint replacement. These exercises can be done on a training (exercise) mat, on the floor, on a table or on a bed. Use whatever works the best and is most comfortable for you. Use music or television while you are exercising so that the exercises are a pleasant break in your day. This will make your life better with the exercises acting as a break in routine you can look forward to.  Lying on your back, slowly slide your foot toward your buttocks, raising your knee up off the floor. Then slowly slide your foot back down until your leg is straight again.  Lying on your back spread your legs as far apart as you can without causing discomfort.  Lying on your side, raise your upper leg and foot straight up from the floor as far as is comfortable. Slowly lower the leg and repeat.  Lying on your back, tighten up the muscle in the front of your thigh (quadriceps muscles). You can do this by keeping your leg straight and trying to raise your heel off the floor. This helps strengthen the largest muscle supporting your knee.  Lying on your back, tighten up the muscles of your  buttocks both with the legs straight and with the knee bent at a comfortable angle while keeping your heel on the floor.   IF YOU ARE TRANSFERRED TO A SKILLED REHAB FACILITY If the patient is transferred to a skilled rehab facility following release from the hospital, a list of the current medications will be sent to the facility for the patient to continue.  When discharged from the skilled rehab facility, please have the facility set up the patient's [place] prior to being released. Also, the skilled facility will be responsible for providing the patient with their medications at time of release from the facility to include their pain medication, the muscle relaxants, and their blood thinner medication. If the patient is still at the rehab facility at time of the two week follow up appointment, the skilled rehab facility will also need to assist the patient in arranging follow up appointment in our office and any transportation needs.  MAKE SURE YOU:  Understand these instructions.  Get help right away if you are not doing well or get worse.    Pick up stool softner and laxative for home use following surgery while on pain medications. Do not submerge incision under water. Please use good hand washing techniques while changing dressing each day. May shower starting three days after surgery. Please use a clean towel to pat the incision dry following showers. Continue to use ice for pain and swelling after surgery. Do not use any lotions or creams on the incision until instructed by your surgeon.  Take Xarelto for two and a half more weeks following discharge from the hospital, then discontinue Xarelto. Once the patient has completed the blood thinner regimen, then take a Baby 81 mg Aspirin daily for three more weeks.  Information on my medicine - XARELTO (Rivaroxaban)  This medication education was reviewed with me or my healthcare representative as part of my discharge  preparation.  The pharmacist that spoke with me during my hospital stay was:  Leanna Battles, Student-PharmD  Why was Xarelto prescribed for you? Xarelto was prescribed for you to reduce the risk of blood clots forming after orthopedic surgery. The medical term for these abnormal blood clots is venous thromboembolism (VTE).  What do you need to know about xarelto ?  Take your Xarelto ONCE DAILY at the same time every day. You may take it either with or without food.  If you have difficulty swallowing the tablet whole, you may crush it and mix in applesauce just prior to taking your dose.  Take Xarelto exactly as prescribed by your doctor and DO NOT stop taking Xarelto without talking to the doctor who prescribed the medication.  Stopping without other VTE prevention medication to take the place of Xarelto may increase your risk of developing a clot.  After discharge, you should have regular check-up appointments with your healthcare provider that is prescribing your Xarelto.    What do you do if you miss a dose? If you miss a dose, take it as soon as you remember on the same day then continue your regularly scheduled once daily regimen the next day. Do not take two doses of Xarelto on the same day.   Important Safety Information A possible side effect of Xarelto is bleeding. You should call your healthcare provider right away if you experience any of the following: ? Bleeding from an injury or your nose that does not stop. ? Unusual colored urine (red or dark brown) or unusual colored stools (red or black). ? Unusual bruising for unknown reasons. ? A serious fall or if you hit your head (even if there is no bleeding).  Some medicines may interact with Xarelto and might increase your risk of bleeding while on Xarelto. To help avoid this, consult your healthcare provider or pharmacist prior to using any new prescription or non-prescription medications, including herbals, vitamins,  non-steroidal anti-inflammatory drugs (NSAIDs) and supplements.  This website has more information on Xarelto: [email or web address]-[email or web address]

## 2017-01-12 NOTE — Care Management Note (Signed)
Case Management Note  Patient Details  Name: Benjamin Rivas MRN: [id number] Date of Birth: 1946-07-29  Subjective/Objective:                  LEFT TOTAL HIP ARTHROPLASTY ANTERIOR APPROACH (Left) Action/Plan: Discharge planning Expected Discharge Date:  01/12/17               Expected Discharge Plan:  [place]  In-House Referral:     Discharge planning Services  CM Consult  Post Acute Care Choice:  Home Health Choice offered to:  Patient  DME Arranged:  Walker rolling DME Agency:  [place]:  PT [place] Agency:  [facility] (formerly [facility])  Status of Service:  Completed, signed off  If discussed at [employer] of Stay Meetings, dates discussed:    Additional Comments: CM met with pt in room to offer choice of home health agency. Pt chooses [facility] to render HHPT. Referral called to [facility] rep, Tim. CM notified [place] DME rep, Joelene Millin to please deliver the rolling walker to room prior to discharge. No other CM needs were communicated. Dellie Catholic, RN 01/12/2017, 11:16 AM

## 2017-01-13 DIAGNOSIS — Z471 Aftercare following joint replacement surgery: Secondary | ICD-10-CM | POA: Diagnosis not present

## 2017-01-13 DIAGNOSIS — Z96642 Presence of left artificial hip joint: Secondary | ICD-10-CM | POA: Diagnosis not present

## 2017-01-13 DIAGNOSIS — Z8546 Personal history of malignant neoplasm of prostate: Secondary | ICD-10-CM | POA: Diagnosis not present

## 2017-01-13 DIAGNOSIS — M170 Bilateral primary osteoarthritis of knee: Secondary | ICD-10-CM | POA: Diagnosis not present

## 2017-01-16 DIAGNOSIS — M170 Bilateral primary osteoarthritis of knee: Secondary | ICD-10-CM | POA: Diagnosis not present

## 2017-01-16 DIAGNOSIS — Z96642 Presence of left artificial hip joint: Secondary | ICD-10-CM | POA: Diagnosis not present

## 2017-01-16 DIAGNOSIS — Z8546 Personal history of malignant neoplasm of prostate: Secondary | ICD-10-CM | POA: Diagnosis not present

## 2017-01-16 DIAGNOSIS — Z471 Aftercare following joint replacement surgery: Secondary | ICD-10-CM | POA: Diagnosis not present

## 2017-01-19 DIAGNOSIS — Z8546 Personal history of malignant neoplasm of prostate: Secondary | ICD-10-CM | POA: Diagnosis not present

## 2017-01-19 DIAGNOSIS — M170 Bilateral primary osteoarthritis of knee: Secondary | ICD-10-CM | POA: Diagnosis not present

## 2017-01-19 DIAGNOSIS — Z96642 Presence of left artificial hip joint: Secondary | ICD-10-CM | POA: Diagnosis not present

## 2017-01-19 DIAGNOSIS — Z471 Aftercare following joint replacement surgery: Secondary | ICD-10-CM | POA: Diagnosis not present

## 2017-01-23 DIAGNOSIS — Z8546 Personal history of malignant neoplasm of prostate: Secondary | ICD-10-CM | POA: Diagnosis not present

## 2017-01-23 DIAGNOSIS — Z96642 Presence of left artificial hip joint: Secondary | ICD-10-CM | POA: Diagnosis not present

## 2017-01-23 DIAGNOSIS — M170 Bilateral primary osteoarthritis of knee: Secondary | ICD-10-CM | POA: Diagnosis not present

## 2017-01-23 DIAGNOSIS — Z471 Aftercare following joint replacement surgery: Secondary | ICD-10-CM | POA: Diagnosis not present

## 2017-01-25 DIAGNOSIS — Z8546 Personal history of malignant neoplasm of prostate: Secondary | ICD-10-CM | POA: Diagnosis not present

## 2017-01-25 DIAGNOSIS — Z96642 Presence of left artificial hip joint: Secondary | ICD-10-CM | POA: Diagnosis not present

## 2017-01-25 DIAGNOSIS — M170 Bilateral primary osteoarthritis of knee: Secondary | ICD-10-CM | POA: Diagnosis not present

## 2017-01-25 DIAGNOSIS — Z471 Aftercare following joint replacement surgery: Secondary | ICD-10-CM | POA: Diagnosis not present

## 2017-02-07 DIAGNOSIS — G4733 Obstructive sleep apnea (adult) (pediatric): Secondary | ICD-10-CM | POA: Diagnosis not present

## 2017-02-07 DIAGNOSIS — J301 Allergic rhinitis due to pollen: Secondary | ICD-10-CM | POA: Diagnosis not present

## 2017-02-07 DIAGNOSIS — E7800 Pure hypercholesterolemia, unspecified: Secondary | ICD-10-CM | POA: Diagnosis not present

## 2017-02-07 DIAGNOSIS — M179 Osteoarthritis of knee, unspecified: Secondary | ICD-10-CM | POA: Diagnosis not present

## 2017-02-11 IMAGING — CR DG CHEST 2V
2 series · 2 of 2 positions shown · non-contrast
Comparison: None.

CLINICAL DATA: Preop for prostatectomy secondary to cancer. Short
of breath with exertion.

EXAM:
CHEST  2 VIEW

[w chest pa]
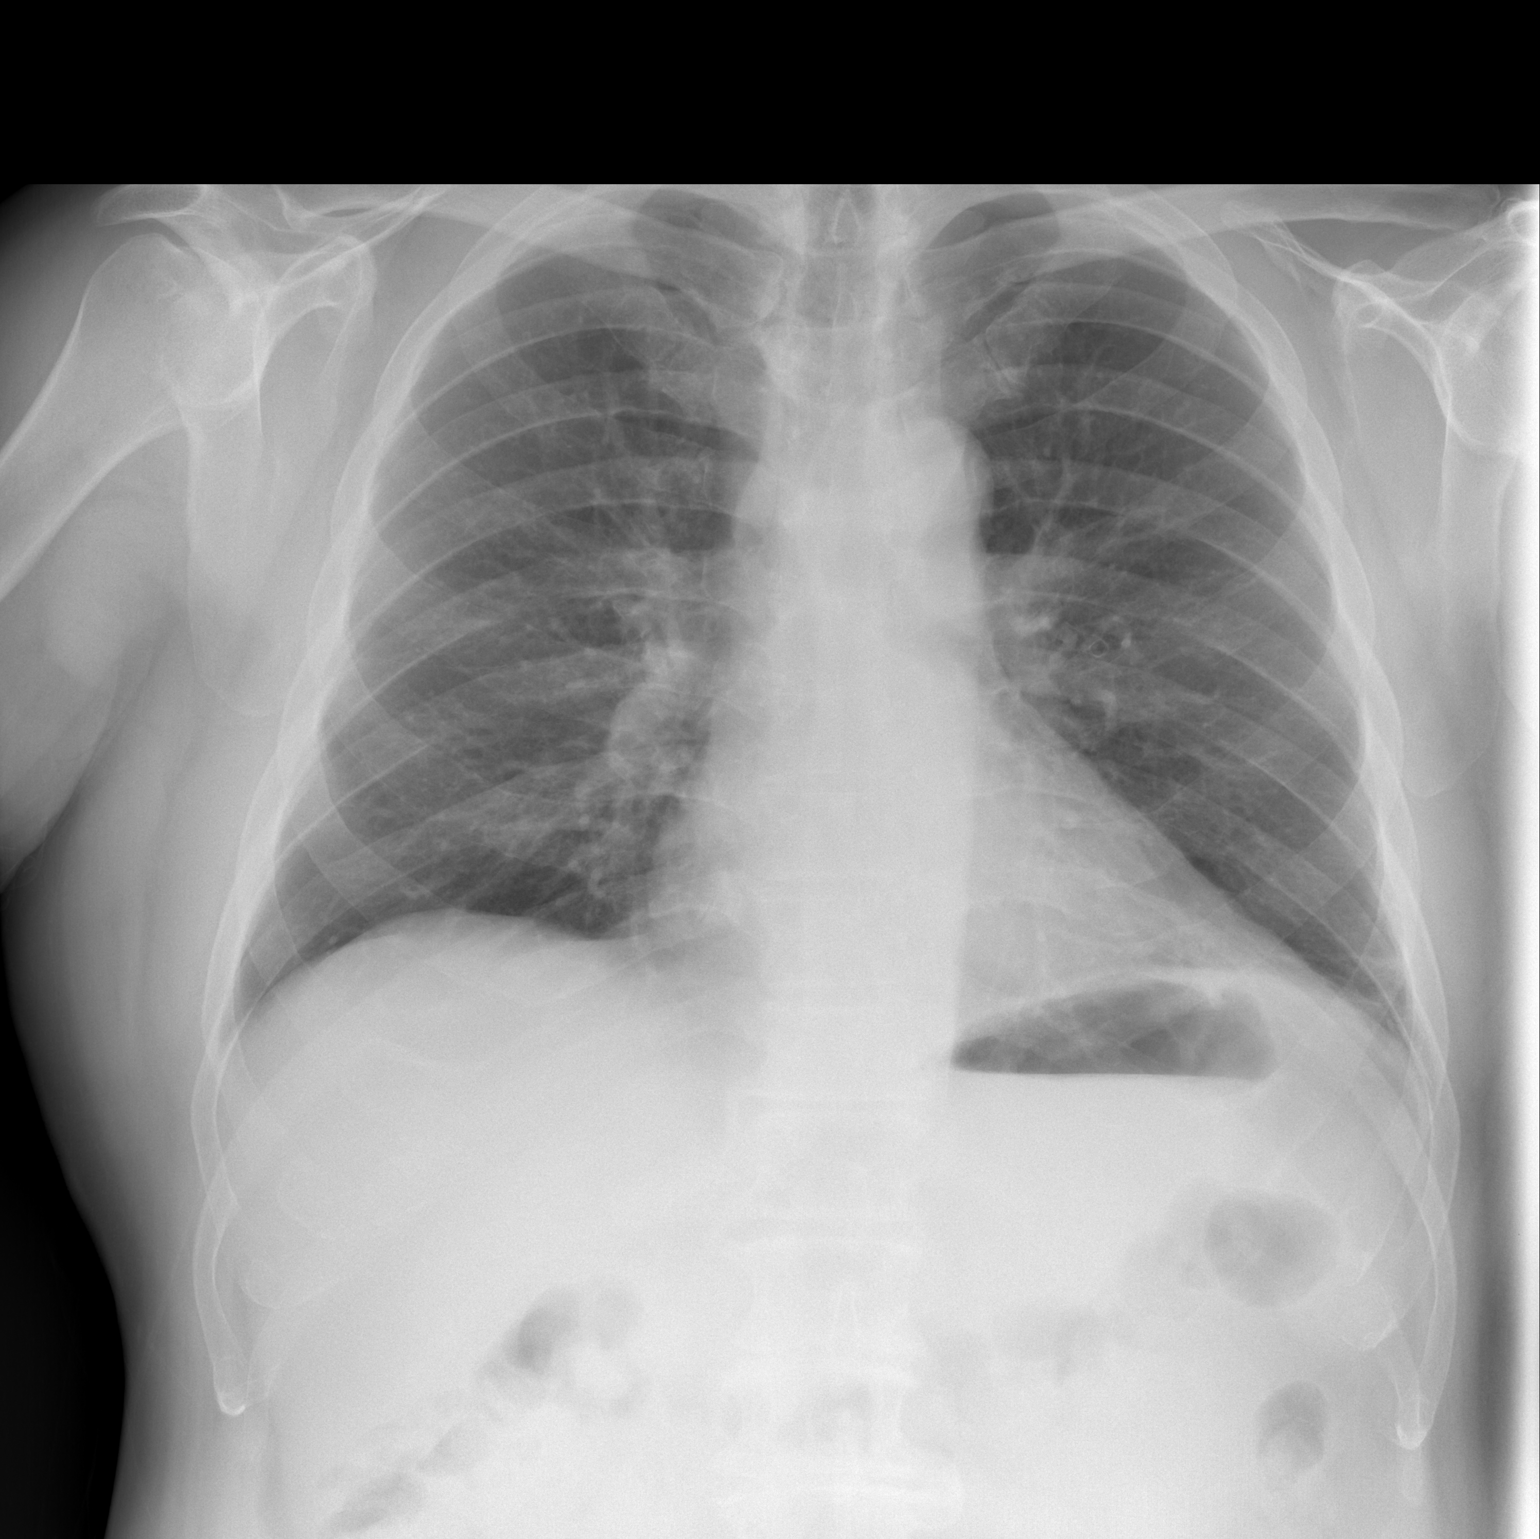

[w chest lat]
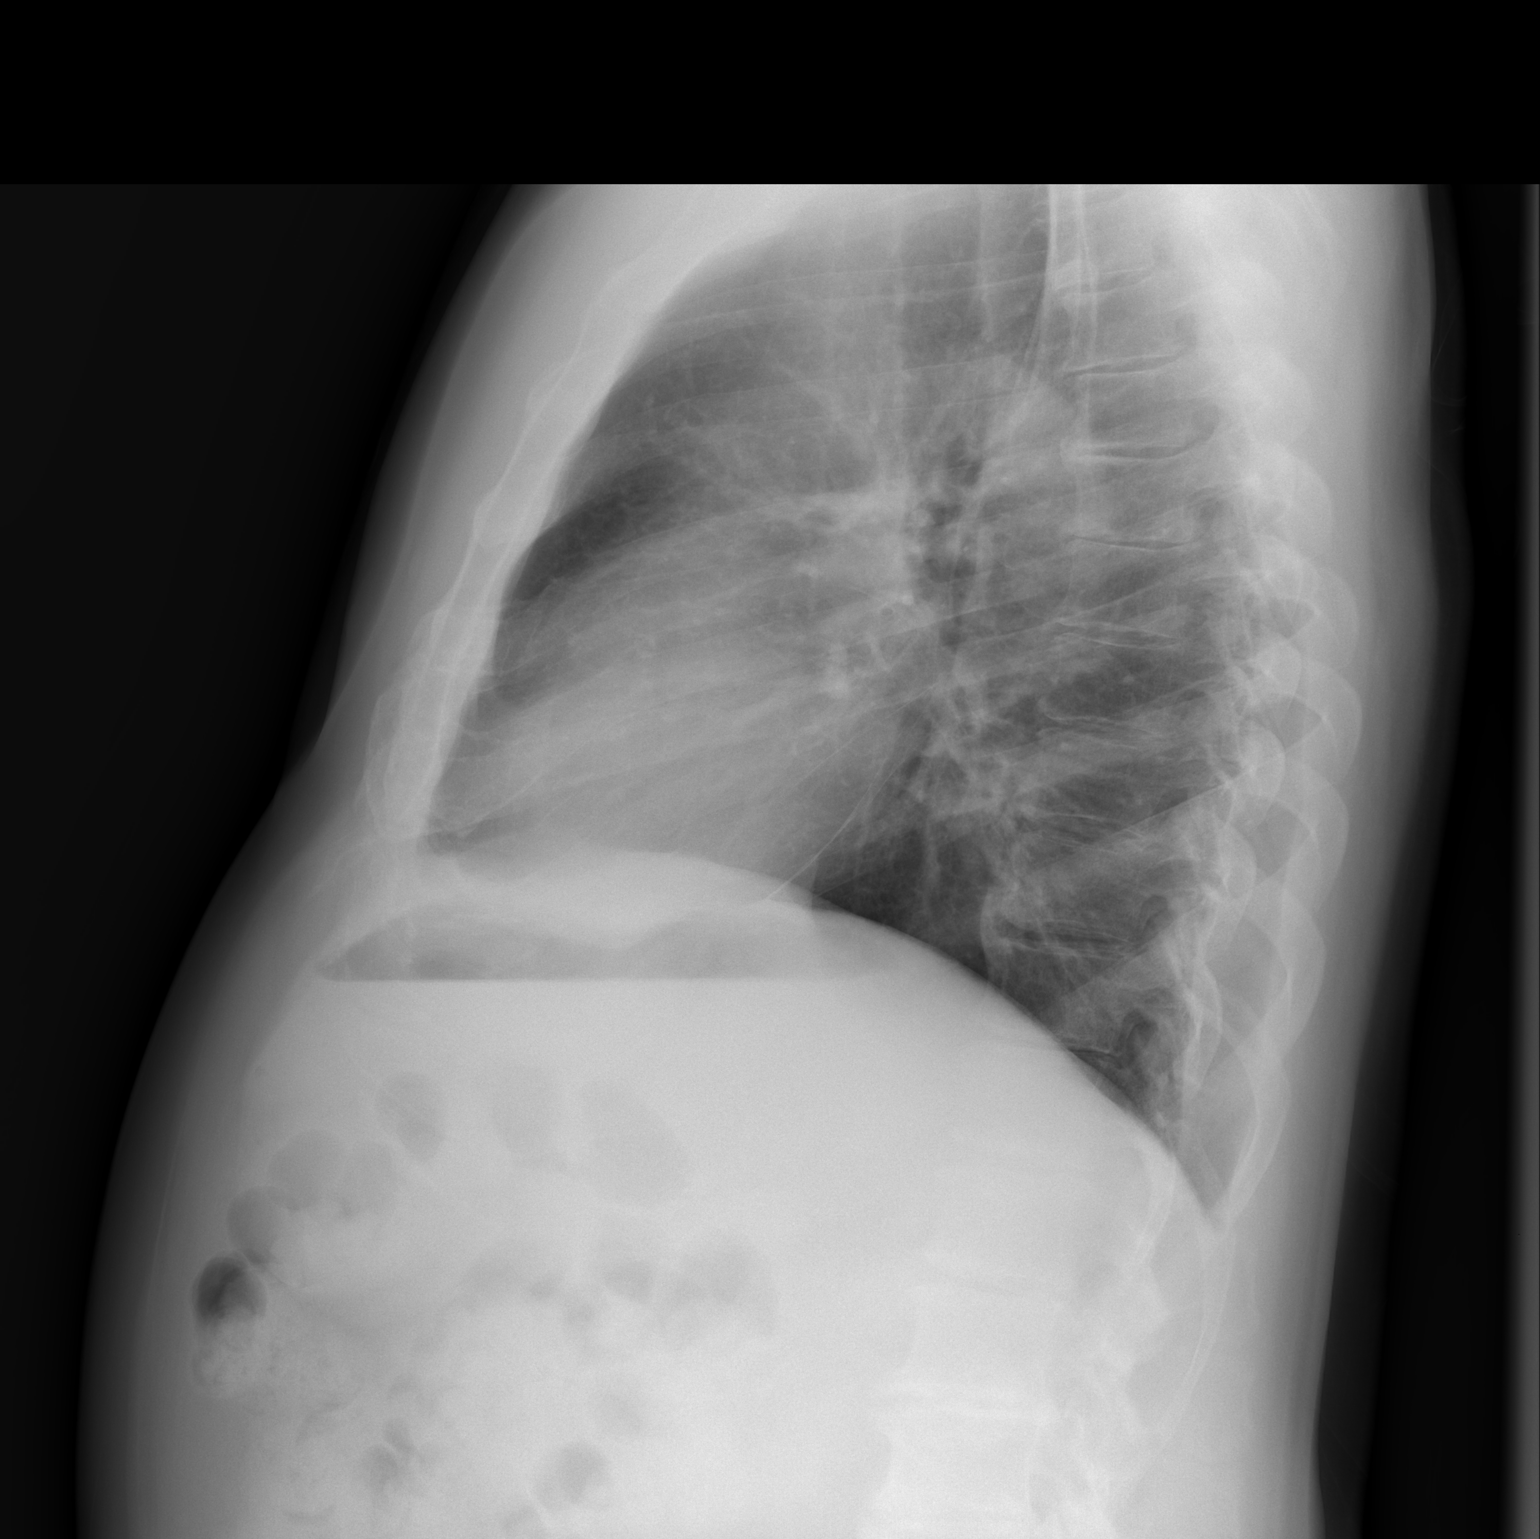

[2 of 2 positions shown; findings below may reference images not displayed]

FINDINGS: Lower thoracic spondylosis. Degenerative changes involving the right
acromioclavicular joint. Question remote trauma or surgery with
foreshortening of the distal right clavicle. Midline trachea. Normal
heart size and mediastinal contours. No pleural effusion or
pneumothorax. Minimal left base scarring or subsegmental
atelectasis. Clear right lung.
IMPRESSION: No acute cardiopulmonary disease.

No evidence of metastatic disease.

## 2017-02-14 DIAGNOSIS — Z471 Aftercare following joint replacement surgery: Secondary | ICD-10-CM | POA: Diagnosis not present

## 2017-02-14 DIAGNOSIS — Z96642 Presence of left artificial hip joint: Secondary | ICD-10-CM | POA: Diagnosis not present

## 2017-03-08 DIAGNOSIS — G4733 Obstructive sleep apnea (adult) (pediatric): Secondary | ICD-10-CM | POA: Diagnosis not present

## 2017-03-08 DIAGNOSIS — G2581 Restless legs syndrome: Secondary | ICD-10-CM | POA: Diagnosis not present

## 2017-03-08 DIAGNOSIS — Z6830 Body mass index (BMI) 30.0-30.9, adult: Secondary | ICD-10-CM | POA: Diagnosis not present

## 2017-03-21 DIAGNOSIS — G4733 Obstructive sleep apnea (adult) (pediatric): Secondary | ICD-10-CM | POA: Diagnosis not present

## 2017-03-21 DIAGNOSIS — M169 Osteoarthritis of hip, unspecified: Secondary | ICD-10-CM | POA: Diagnosis not present

## 2017-03-23 DIAGNOSIS — G4733 Obstructive sleep apnea (adult) (pediatric): Secondary | ICD-10-CM | POA: Diagnosis not present

## 2017-03-23 DIAGNOSIS — M169 Osteoarthritis of hip, unspecified: Secondary | ICD-10-CM | POA: Diagnosis not present

## 2017-05-08 NOTE — Addendum Note (Signed)
Addendum  created 05/08/17 1057 by Laconya Clere, MD   Sign clinical note    

## 2017-06-21 DIAGNOSIS — N5201 Erectile dysfunction due to arterial insufficiency: Secondary | ICD-10-CM | POA: Diagnosis not present

## 2017-06-21 DIAGNOSIS — Z8546 Personal history of malignant neoplasm of prostate: Secondary | ICD-10-CM | POA: Diagnosis not present

## 2017-06-23 DIAGNOSIS — M169 Osteoarthritis of hip, unspecified: Secondary | ICD-10-CM | POA: Diagnosis not present

## 2017-06-23 DIAGNOSIS — G4733 Obstructive sleep apnea (adult) (pediatric): Secondary | ICD-10-CM | POA: Diagnosis not present

## 2017-07-12 DIAGNOSIS — J301 Allergic rhinitis due to pollen: Secondary | ICD-10-CM | POA: Diagnosis not present

## 2017-07-12 DIAGNOSIS — Z0000 Encounter for general adult medical examination without abnormal findings: Secondary | ICD-10-CM | POA: Diagnosis not present

## 2017-07-12 DIAGNOSIS — R030 Elevated blood-pressure reading, without diagnosis of hypertension: Secondary | ICD-10-CM | POA: Diagnosis not present

## 2017-07-12 DIAGNOSIS — E785 Hyperlipidemia, unspecified: Secondary | ICD-10-CM | POA: Diagnosis not present

## 2017-07-12 DIAGNOSIS — M1712 Unilateral primary osteoarthritis, left knee: Secondary | ICD-10-CM | POA: Diagnosis not present

## 2017-07-12 DIAGNOSIS — G4733 Obstructive sleep apnea (adult) (pediatric): Secondary | ICD-10-CM | POA: Diagnosis not present

## 2017-07-12 DIAGNOSIS — Z6830 Body mass index (BMI) 30.0-30.9, adult: Secondary | ICD-10-CM | POA: Diagnosis not present

## 2017-07-12 DIAGNOSIS — Z79899 Other long term (current) drug therapy: Secondary | ICD-10-CM | POA: Diagnosis not present

## 2017-07-12 DIAGNOSIS — K219 Gastro-esophageal reflux disease without esophagitis: Secondary | ICD-10-CM | POA: Diagnosis not present

## 2017-08-21 DIAGNOSIS — Z6831 Body mass index (BMI) 31.0-31.9, adult: Secondary | ICD-10-CM | POA: Diagnosis not present

## 2017-08-21 DIAGNOSIS — J329 Chronic sinusitis, unspecified: Secondary | ICD-10-CM | POA: Diagnosis not present

## 2017-08-24 DIAGNOSIS — H524 Presbyopia: Secondary | ICD-10-CM | POA: Diagnosis not present

## 2017-09-20 DIAGNOSIS — Z23 Encounter for immunization: Secondary | ICD-10-CM | POA: Diagnosis not present

## 2017-09-26 DIAGNOSIS — M169 Osteoarthritis of hip, unspecified: Secondary | ICD-10-CM | POA: Diagnosis not present

## 2017-09-26 DIAGNOSIS — G4733 Obstructive sleep apnea (adult) (pediatric): Secondary | ICD-10-CM | POA: Diagnosis not present

## 2017-10-03 DIAGNOSIS — M169 Osteoarthritis of hip, unspecified: Secondary | ICD-10-CM | POA: Diagnosis not present

## 2017-10-03 DIAGNOSIS — G4733 Obstructive sleep apnea (adult) (pediatric): Secondary | ICD-10-CM | POA: Diagnosis not present

## 2017-11-14 DIAGNOSIS — Z6831 Body mass index (BMI) 31.0-31.9, adult: Secondary | ICD-10-CM | POA: Diagnosis not present

## 2017-11-14 DIAGNOSIS — J019 Acute sinusitis, unspecified: Secondary | ICD-10-CM | POA: Diagnosis not present

## 2017-11-14 DIAGNOSIS — B9689 Other specified bacterial agents as the cause of diseases classified elsewhere: Secondary | ICD-10-CM | POA: Diagnosis not present

## 2018-01-03 DIAGNOSIS — J0120 Acute ethmoidal sinusitis, unspecified: Secondary | ICD-10-CM | POA: Diagnosis not present

## 2018-01-03 DIAGNOSIS — Z6831 Body mass index (BMI) 31.0-31.9, adult: Secondary | ICD-10-CM | POA: Diagnosis not present

## 2018-01-03 DIAGNOSIS — G4733 Obstructive sleep apnea (adult) (pediatric): Secondary | ICD-10-CM | POA: Diagnosis not present

## 2018-01-10 DIAGNOSIS — N5201 Erectile dysfunction due to arterial insufficiency: Secondary | ICD-10-CM | POA: Diagnosis not present

## 2018-01-10 DIAGNOSIS — Z8546 Personal history of malignant neoplasm of prostate: Secondary | ICD-10-CM | POA: Diagnosis not present

## 2018-01-11 DIAGNOSIS — M1612 Unilateral primary osteoarthritis, left hip: Secondary | ICD-10-CM | POA: Diagnosis not present

## 2018-01-11 DIAGNOSIS — Z96642 Presence of left artificial hip joint: Secondary | ICD-10-CM | POA: Diagnosis not present

## 2018-01-11 DIAGNOSIS — M1712 Unilateral primary osteoarthritis, left knee: Secondary | ICD-10-CM | POA: Diagnosis not present

## 2018-01-11 DIAGNOSIS — Z471 Aftercare following joint replacement surgery: Secondary | ICD-10-CM | POA: Diagnosis not present

## 2018-02-21 ENCOUNTER — Encounter: Payer: Self-pay | Admitting: Gastroenterology

## 2018-03-30 DIAGNOSIS — M169 Osteoarthritis of hip, unspecified: Secondary | ICD-10-CM | POA: Diagnosis not present

## 2018-03-30 DIAGNOSIS — G4733 Obstructive sleep apnea (adult) (pediatric): Secondary | ICD-10-CM | POA: Diagnosis not present

## 2018-04-03 ENCOUNTER — Ambulatory Visit (INDEPENDENT_AMBULATORY_CARE_PROVIDER_SITE_OTHER): Payer: Medicare HMO | Admitting: Gastroenterology

## 2018-04-03 ENCOUNTER — Encounter: Payer: Self-pay | Admitting: Gastroenterology

## 2018-04-03 VITALS — BP 144/78 | HR 85 | Ht 66.5 in | Wt 193.2 lb

## 2018-04-03 DIAGNOSIS — Z860100 Personal history of colon polyps, unspecified: Secondary | ICD-10-CM

## 2018-04-03 DIAGNOSIS — K219 Gastro-esophageal reflux disease without esophagitis: Secondary | ICD-10-CM | POA: Diagnosis not present

## 2018-04-03 MED ORDER — SOD PICOSULFATE-MAG OX-CIT ACD 10-3.5-12 MG-GM -GM/160ML PO SOLN: 1.0000 | Freq: Once | ORAL | 0 refills | Status: AC

## 2018-04-03 NOTE — Progress Notes (Signed)
Chief Complaint: For colonoscopy and GERD.  Referring Provider:  Mateo Flow, MD      ASSESSMENT AND PLAN;  1. 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 GERD with H/O small HH. - Proceed with colonoscopy.  I have discussed the risks and benefits.  The risks including risk of perforation requiring laparotomy, bleeding after polypectomy requiring blood transfusions and risks of anesthesia/sedation were discussed.  Rare risks of missing colorectal neoplasms were also discussed.  Alternatives were given.  Patient is fully aware and agrees to proceed. All the questions were answered. Colonoscopy will be scheduled in upcoming days.  Patient is to report immediately if there is any significant weight loss or excessive bleeding until then. Consent forms were given for review. -Continue PPIs for now. Try every other day. I have explained the long-term side effects of using PPIs including risks of osteoporosis with the risks of fractures of the hip, wrist and spine. I have also discussed increased risk for chronic kidney disease, increased risk of C diff, increased risk of pneumonia (short term usage), potentially increased risk of B12 / calcium deficiency, and rare risk of hypomagnesemia. Recent studies have also shown an association with increased risk of dementia and cardiovascular outcomes including stroke. These studies have showed an association but no evidence of causality. The patient was counseled to use the lowest dose of PPI needed to control symptoms as suggested by FDA. I have encouraged patient to increase calcium to the recommended dose and add vitamin D. No indication for rpt EGD.    HPI:   72 year old very pleasant white male here for colorectal cancer screening/surveillance and gastroesophageal reflux.  No nausea, vomiting, heartburn (on nexium), regurgitation, odynophagia or dysphagia.  No significant diarrhea or constipation.  There is no melena or hematochezia. No unintentional weight  loss.    Past Medical History:  Diagnosis Date  . Arthritis    L knee  . Bone spur    left hip  . Cancer [facility])    prostate cancer  . Complication of anesthesia    "stopped breathing with knee surgery-with following prostate surgery"  . ED (erectile dysfunction)   . GERD (gastroesophageal reflux disease)   . High cholesterol   . Ringing in ears   . Seasonal allergies   . Sleep apnea    cpap    Past Surgical History:  Procedure Laterality Date  . COLONOSCOPY  11/19/2012   tubular adenoma  . ESOPHAGOGASTRODUODENOSCOPY  11/19/2012   Small hiatal hernia.   Marland Kitchen HIP SURGERY Left age 36  . KNEE ARTHROSCOPY Left   . LYMPHADENECTOMY Bilateral 04/20/2015   Procedure: PELVIC LYMPHADENECTOMY;  Surgeon: Raynelle Bring, MD;  Location: [facility];  Service: Urology;  Laterality: Bilateral;  . PROSTATE BIOPSY     x2  . ROBOT ASSISTED LAPAROSCOPIC RADICAL PROSTATECTOMY N/A 04/20/2015   Procedure: ROBOTIC ASSISTED LAPAROSCOPIC RADICAL PROSTATECTOMY LEVEL 2;  Surgeon: Raynelle Bring, MD;  Location: [facility];  Service: Urology;  Laterality: N/A;  . ROTATOR CUFF REPAIR Right   . TOTAL HIP ARTHROPLASTY Left 01/11/2017   Procedure: LEFT TOTAL HIP ARTHROPLASTY ANTERIOR APPROACH;  Surgeon: Gaynelle Arabian, MD;  Location: [facility];  Service: Orthopedics;  Laterality: Left;  . UPPER GI ENDOSCOPY  11/19/2012   small hiatal hernia  . VASECTOMY  1974  . WISDOM TOOTH EXTRACTION      History reviewed. No pertinent family history.  Social History   Tobacco Use  . Smoking status: Never Smoker  . Smokeless tobacco: Never  Used  Substance Use Topics  . Alcohol use: No  . Drug use: No    Current Outpatient Medications  Medication Sig Dispense Refill  . aspirin 81 MG chewable tablet Chew 81 mg by mouth daily.    Marland Kitchen esomeprazole (NEXIUM) 20 MG capsule Take 20 mg by mouth daily at 12 noon.    . Melatonin 5 MG TABS Take 5 mg by mouth at bedtime.    . montelukast (SINGULAIR) 10 MG tablet Take 10 mg by mouth every  morning.    . Polyvinyl Alcohol-Povidone (REFRESH OP) Apply 1 drop to eye daily as needed (dry eyes).    . Triamcinolone Acetonide (NASACORT AQ NA) Place 1 spray into the nose daily.      No current facility-administered medications for this visit.     No Known Allergies  Review of Systems:  Constitutional: Denies fever, chills, diaphoresis, appetite change and fatigue.  HEENT: Denies photophobia, eye pain, redness, hearing loss, ear pain, congestion, sore throat, rhinorrhea, sneezing, mouth sores, neck pain, neck stiffness and tinnitus.   Respiratory: Denies SOB, DOE, cough, chest tightness,  and wheezing.   Cardiovascular: Denies chest pain, palpitations and leg swelling.  Genitourinary: Denies dysuria, urgency, frequency, hematuria, flank pain and difficulty urinating.  Musculoskeletal: Denies myalgias, back pain, joint swelling, arthralgias and gait problem.  Skin: No rash. .  Neurological: Denies dizziness, seizures, syncope, weakness, light-headedness, numbness and headaches.  Hematological: Denies adenopathy. Easy bruising, personal or family bleeding history  Psychiatric/Behavioral: No anxiety or depression     Physical Exam:    BP (!) 144/78   Pulse 85   Ht 5' 6.5" (1.689 m)   Wt 193 lb 4 oz (87.7 kg)   SpO2 98%   BMI 30.72 kg/m  Filed Weights   04/03/18 0850  Weight: 193 lb 4 oz (87.7 kg)   Constitutional:  Well-developed, in no acute distress. Psychiatric: Normal mood and affect. Behavior is normal. HEENT: Pupils normal.  Conjunctivae are normal. No scleral icterus. Neck supple.  Cardiovascular: Normal rate, regular rhythm. No edema Pulmonary/chest: Effort normal and breath sounds normal. No wheezing, rales or rhonchi. Abdominal: Soft, nondistended. Nontender. Bowel sounds active throughout. There are no masses palpable. No hepatomegaly. Neurological: Alert and oriented to person place and time. Skin: Skin is warm and dry. No rashes noted.    Carmell Austria, MD    Cc: Mateo Flow, MD

## 2018-04-03 NOTE — Patient Instructions (Signed)
If you are age 71 or older, your body mass index should be between 23-30. Your Body mass index is 30.72 kg/m. If this is out of the aforementioned range listed, please consider follow up with your Primary Care Provider.  If you are age 17 or younger, your body mass index should be between 19-25. Your Body mass index is 30.72 kg/m. If this is out of the aformentioned range listed, please consider follow up with your Primary Care Provider.   We have given you samples of the following medication to take: Clenpiq  Please purchase the following medications over the counter and take as directed: Two days before your procedure: Mix 3 packs (or capfuls) of Miralax in 48 ounces of clear liquid and drink at 6pm.  You have been scheduled for a colonoscopy. Please follow written instructions given to you at your visit today.  Please pick up your prep supplies at the pharmacy within the next 1-3 days. If you use inhalers (even only as needed), please bring them with you on the day of your procedure. Your physician has requested that you go to www.startemmi.com and enter the access code given to you at your visit today. This web site gives a general overview about your procedure. However, you should still follow specific instructions given to you by our office regarding your preparation for the procedure.   Thank you,  Dr. Jackquline Denmark

## 2018-05-01 ENCOUNTER — Encounter: Payer: Self-pay | Admitting: Gastroenterology

## 2018-05-14 ENCOUNTER — Encounter: Payer: Self-pay | Admitting: Gastroenterology

## 2018-05-14 ENCOUNTER — Ambulatory Visit (AMBULATORY_SURGERY_CENTER): Payer: Medicare HMO | Admitting: Gastroenterology

## 2018-05-14 ENCOUNTER — Other Ambulatory Visit: Payer: Self-pay

## 2018-05-14 VITALS — BP 128/77 | HR 77 | Temp 98.4°F | Resp 18 | Ht 66.0 in | Wt 193.0 lb

## 2018-05-14 DIAGNOSIS — Z86010 Personal history of colonic polyps: Secondary | ICD-10-CM | POA: Diagnosis not present

## 2018-05-14 DIAGNOSIS — D122 Benign neoplasm of ascending colon: Secondary | ICD-10-CM | POA: Diagnosis not present

## 2018-05-14 DIAGNOSIS — D124 Benign neoplasm of descending colon: Secondary | ICD-10-CM | POA: Diagnosis not present

## 2018-05-14 MED ORDER — SODIUM CHLORIDE 0.9 % IV SOLN: 500.0000 mL | Freq: Once | INTRAVENOUS | Status: DC

## 2018-05-14 NOTE — Progress Notes (Signed)
Called to room to assist during endoscopic procedure.  Patient ID and intended procedure confirmed with present staff. Received instructions for my participation in the procedure from the performing physician.  

## 2018-05-14 NOTE — Progress Notes (Signed)
No problems noted in the recovery room. maw 

## 2018-05-14 NOTE — Patient Instructions (Addendum)
YOU HAD AN ENDOSCOPIC PROCEDURE TODAY AT [facility]:   Refer to the procedure report that was given to you for any specific questions about what was found during the examination.  If the procedure report does not answer your questions, please call your gastroenterologist to clarify.  If you requested that your care partner not be given the details of your procedure findings, then the procedure report has been included in a sealed envelope for you to review at your convenience later.  YOU SHOULD EXPECT: Some feelings of bloating in the abdomen. Passage of more gas than usual.  Walking can help get rid of the air that was put into your GI tract during the procedure and reduce the bloating. If you had a lower endoscopy (such as a colonoscopy or flexible sigmoidoscopy) you may notice spotting of blood in your stool or on the toilet paper. If you underwent a bowel prep for your procedure, you may not have a normal bowel movement for a few days.  Please Note:  You might notice some irritation and congestion in your nose or some drainage.  This is from the oxygen used during your procedure.  There is no need for concern and it should clear up in a day or so.  SYMPTOMS TO REPORT IMMEDIATELY:   Following lower endoscopy (colonoscopy or flexible sigmoidoscopy):  Excessive amounts of blood in the stool  Significant tenderness or worsening of abdominal pains  Swelling of the abdomen that is new, acute  Fever of 100F or higher  For urgent or emergent issues, a gastroenterologist can be reached at any hour by calling [phone].   DIET:  We do recommend a small meal at first, but then you may proceed to your regular diet.  Drink plenty of fluids but you should avoid alcoholic beverages for 24 hours.  ACTIVITY:  You should plan to take it easy for the rest of today and you should NOT DRIVE or use heavy machinery until tomorrow (because of the sedation medicines used during the test).     FOLLOW UP: Our staff will call the number listed on your records the next business day following your procedure to check on you and address any questions or concerns that you may have regarding the information given to you following your procedure. If we do not reach you, we will leave a message.  However, if you are feeling well and you are not experiencing any problems, there is no need to return our call.  We will assume that you have returned to your regular daily activities without incident.  If any biopsies were taken you will be contacted by phone or by letter within the next 1-3 weeks.  Please call us at [phone] if you have not heard about the biopsies in 3 weeks.    SIGNATURES/CONFIDENTIALITY: You and/or your care partner have signed paperwork which will be entered into your electronic medical record.  These signatures attest to the fact that that the information above on your After Visit Summary has been reviewed and is understood.  Full responsibility of the confidentiality of this discharge information lies with you and/or your care-partner.   Handouts were given to your care partner on polyps, diverticulosis, and hemorrhoids. NO ASPIRIN, ASPIRIN CONTAINING PRODUCTS (BC OR GOODY POWDERS) OR NSAIDS (IBUPROFEN, ADVIL, ALEVE, AND MOTRIN) FOR 5 days; TYLENOL IS OK TO TAKE.  You may take your aspirin 81 mg per Dr. Lyndel Safe. You may resume your other current medications today.  Await biopsy results. Please call if any questions or concerns.

## 2018-05-14 NOTE — Progress Notes (Signed)
Per Dr. Lyndel Safe pt may take his asa 81 mg daily.  Explained this to pt and his wife. maw

## 2018-05-14 NOTE — Op Note (Signed)
[place] Patient Name: Benjamin Rivas Procedure Date: 05/14/2018 7:54 AM MRN: [id number] Endoscopist: Jackquline Denmark , MD Age: 72 Referring MD:  Date of Birth: 12/11/1945 Gender: Male Account #: [id number] Procedure:                Colonoscopy Indications:              High risk colon cancer surveillance: Personal                            history of colonic polyps Medicines:                Monitored Anesthesia Care Procedure:                Pre-Anesthesia Assessment:                           - Prior to the procedure, a History and Physical                            was performed, and patient medications and                            allergies were reviewed. The patient is competent.                            The risks and benefits of the procedure and the                            sedation options and risks were discussed with the                            patient. All questions were answered and informed                            consent was obtained. Patient identification and                            proposed procedure were verified by the physician                            in the procedure room. Mental Status Examination:                            alert and oriented. Prophylactic Antibiotics: The                            patient does not require prophylactic antibiotics.                            Prior Anticoagulants: The patient has taken no                            previous anticoagulant or antiplatelet agents. ASA  Grade Assessment: II - A patient with mild systemic                            disease. After reviewing the risks and benefits,                            the patient was deemed in satisfactory condition to                            undergo the procedure. The anesthesia plan was to                            use monitored anesthesia care (MAC). Immediately                            prior to administration of  medications, the patient                            was re-assessed for adequacy to receive sedatives.                            The heart rate, respiratory rate, oxygen                            saturations, blood pressure, adequacy of pulmonary                            ventilation, and response to care were monitored                            throughout the procedure. The physical status of                            the patient was re-assessed after the procedure.                           After obtaining informed consent, the colonoscope                            was passed under direct vision. Throughout the                            procedure, the patient's blood pressure, pulse, and                            oxygen saturations were monitored continuously. The                            Model CF-HQ190L [id number]) scope was introduced                            through the anus and advanced to the the cecum,  identified by appendiceal orifice and ileocecal                            valve. The colonoscopy was performed without                            difficulty. The patient tolerated the procedure                            well. The quality of the bowel preparation was                            adequate to identify polyps. Some retained stool                            especially in the right side of the colon. Scope In: 8:06:30 AM Scope Out: 8:26:20 AM Scope Withdrawal Time: 0 hours 11 minutes 4 seconds  Total Procedure Duration: 0 hours 19 minutes 50 seconds  Findings:                 Three sessile polyps were found in the ascending                            colon. The polyps were 6 to 8 mm in size. These                            polyps were removed with a cold snare. Resection                            and retrieval were complete.                           A 8 mm polyp was found in the descending colon. The                             polyp was sessile. The polyp was removed with a                            cold snare. Resection and retrieval were complete.                           A few small-mouthed diverticula were found in the                            sigmoid colon and ascending colon.                           Internal hemorrhoids were found during                            retroflexion. The hemorrhoids were small.                           The exam was otherwise  without abnormality on                            direct and retroflexion views. Complications:            No immediate complications. Estimated Blood Loss:     Estimated blood loss: none. Impression:               - Colonic polyps status post polypectomy.                           - Mild pancolonic diverticulosis predominantly in                            the sigmoid colon. Recommendation:           - Patient has a contact number available for                            emergencies. The signs and symptoms of potential                            delayed complications were discussed with the                            patient. Return to normal activities tomorrow.                            Written discharge instructions were provided to the                            patient.                           - Resume previous diet.                           - Continue present medications.                           - Await pathology results.                           - No aspirin, ibuprofen, naproxen, or other                            non-steroidal anti-inflammatory drugs for 5 days                            after polyp removal.                           - Repeat colonoscopy for surveillance based on                            pathology results.                           - Return  to GI clinic PRN. Jackquline Denmark, MD 05/14/2018 8:34:28 AM This report has been signed electronically.

## 2018-05-14 NOTE — Progress Notes (Signed)
A/ox3 pleased with MAC, report to RN 

## 2018-05-15 ENCOUNTER — Telehealth: Payer: Self-pay

## 2018-05-15 ENCOUNTER — Telehealth: Payer: Self-pay | Admitting: *Deleted

## 2018-05-15 NOTE — Telephone Encounter (Signed)
  Follow up Call-  Call back number 05/14/2018  Post procedure Call Back phone  # [phone]  Permission to leave phone message Yes  Some recent data might be hidden     Patient questions:  Do you have a fever, pain , or abdominal swelling? No. Pain Score  0 *  Have you tolerated food without any problems? Yes.    Have you been able to return to your normal activities? Yes.    Do you have any questions about your discharge instructions: Diet   No. Medications  No. Follow up visit  No.  Do you have questions or concerns about your Care? No.  Actions: * If pain score is 4 or above: No action needed, pain <4.

## 2018-05-15 NOTE — Telephone Encounter (Signed)
  Follow up Call-  Call back number 05/14/2018  Post procedure Call Back phone  # [phone]  Permission to leave phone message Yes  Some recent data might be hidden     Patient questions:  Do you have a fever, pain , or abdominal swelling? No. Pain Score  0 *  Have you tolerated food without any problems? Yes.    Have you been able to return to your normal activities? Yes.    Do you have any questions about your discharge instructions: Diet   No. Medications  No. Follow up visit  No.  Do you have questions or concerns about your Care? No.  Actions: * If pain score is 4 or above: No action needed, pain <4.

## 2018-05-18 ENCOUNTER — Encounter: Payer: Self-pay | Admitting: Gastroenterology

## 2018-05-22 ENCOUNTER — Telehealth: Payer: Self-pay

## 2018-05-22 NOTE — Telephone Encounter (Signed)
3 year recall placed.

## 2018-05-22 NOTE — Telephone Encounter (Signed)
Pathology report letter sent.

## 2018-06-09 DIAGNOSIS — J0100 Acute maxillary sinusitis, unspecified: Secondary | ICD-10-CM | POA: Diagnosis not present

## 2018-06-22 DIAGNOSIS — M1712 Unilateral primary osteoarthritis, left knee: Secondary | ICD-10-CM | POA: Diagnosis not present

## 2018-07-04 DIAGNOSIS — M169 Osteoarthritis of hip, unspecified: Secondary | ICD-10-CM | POA: Diagnosis not present

## 2018-07-04 DIAGNOSIS — G4733 Obstructive sleep apnea (adult) (pediatric): Secondary | ICD-10-CM | POA: Diagnosis not present

## 2018-07-17 DIAGNOSIS — J301 Allergic rhinitis due to pollen: Secondary | ICD-10-CM | POA: Diagnosis not present

## 2018-07-17 DIAGNOSIS — Z1331 Encounter for screening for depression: Secondary | ICD-10-CM | POA: Diagnosis not present

## 2018-07-17 DIAGNOSIS — S61219A Laceration without foreign body of unspecified finger without damage to nail, initial encounter: Secondary | ICD-10-CM | POA: Diagnosis not present

## 2018-07-17 DIAGNOSIS — Z9181 History of falling: Secondary | ICD-10-CM | POA: Diagnosis not present

## 2018-07-17 DIAGNOSIS — E785 Hyperlipidemia, unspecified: Secondary | ICD-10-CM | POA: Diagnosis not present

## 2018-07-17 DIAGNOSIS — Z1339 Encounter for screening examination for other mental health and behavioral disorders: Secondary | ICD-10-CM | POA: Diagnosis not present

## 2018-07-17 DIAGNOSIS — Z0000 Encounter for general adult medical examination without abnormal findings: Secondary | ICD-10-CM | POA: Diagnosis not present

## 2018-07-17 DIAGNOSIS — K219 Gastro-esophageal reflux disease without esophagitis: Secondary | ICD-10-CM | POA: Diagnosis not present

## 2018-07-17 DIAGNOSIS — Z79899 Other long term (current) drug therapy: Secondary | ICD-10-CM | POA: Diagnosis not present

## 2018-08-07 DIAGNOSIS — L814 Other melanin hyperpigmentation: Secondary | ICD-10-CM | POA: Diagnosis not present

## 2018-08-07 DIAGNOSIS — L821 Other seborrheic keratosis: Secondary | ICD-10-CM | POA: Diagnosis not present

## 2018-08-07 DIAGNOSIS — D225 Melanocytic nevi of trunk: Secondary | ICD-10-CM | POA: Diagnosis not present

## 2018-08-07 DIAGNOSIS — D2239 Melanocytic nevi of other parts of face: Secondary | ICD-10-CM | POA: Diagnosis not present

## 2018-08-07 DIAGNOSIS — L570 Actinic keratosis: Secondary | ICD-10-CM | POA: Diagnosis not present

## 2018-08-17 DIAGNOSIS — N5201 Erectile dysfunction due to arterial insufficiency: Secondary | ICD-10-CM | POA: Diagnosis not present

## 2018-08-17 DIAGNOSIS — Z8546 Personal history of malignant neoplasm of prostate: Secondary | ICD-10-CM | POA: Diagnosis not present

## 2018-08-28 DIAGNOSIS — H5203 Hypermetropia, bilateral: Secondary | ICD-10-CM | POA: Diagnosis not present

## 2018-09-19 DIAGNOSIS — Z23 Encounter for immunization: Secondary | ICD-10-CM | POA: Diagnosis not present

## 2018-10-05 DIAGNOSIS — M169 Osteoarthritis of hip, unspecified: Secondary | ICD-10-CM | POA: Diagnosis not present

## 2018-10-05 DIAGNOSIS — G4733 Obstructive sleep apnea (adult) (pediatric): Secondary | ICD-10-CM | POA: Diagnosis not present

## 2018-11-18 IMAGING — DX DG PORTABLE PELVIS
2 series · 2 of 2 positions shown · non-contrast
Comparison: None.

CLINICAL DATA: Postop left total hip replacement.

EXAM:
PORTABLE PELVIS 1-2 VIEWS

[pelvis ap (1 of 2)]
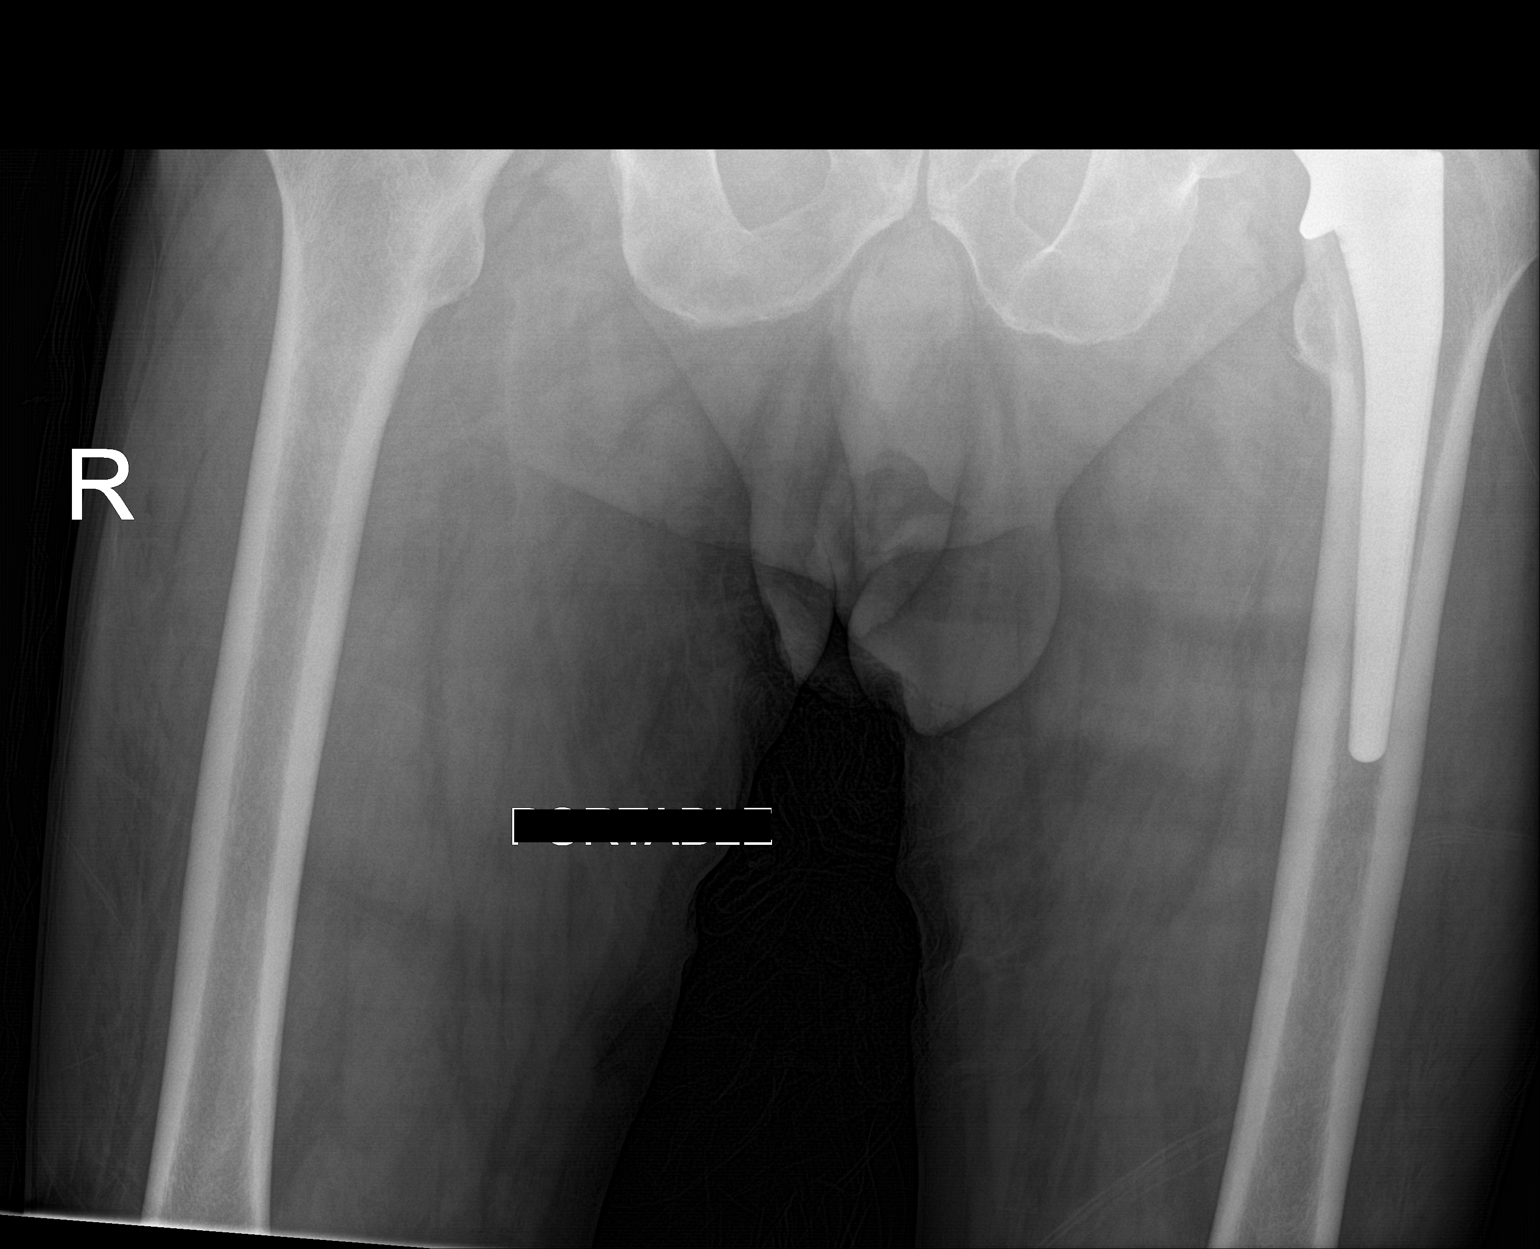

[pelvis ap (2 of 2)]
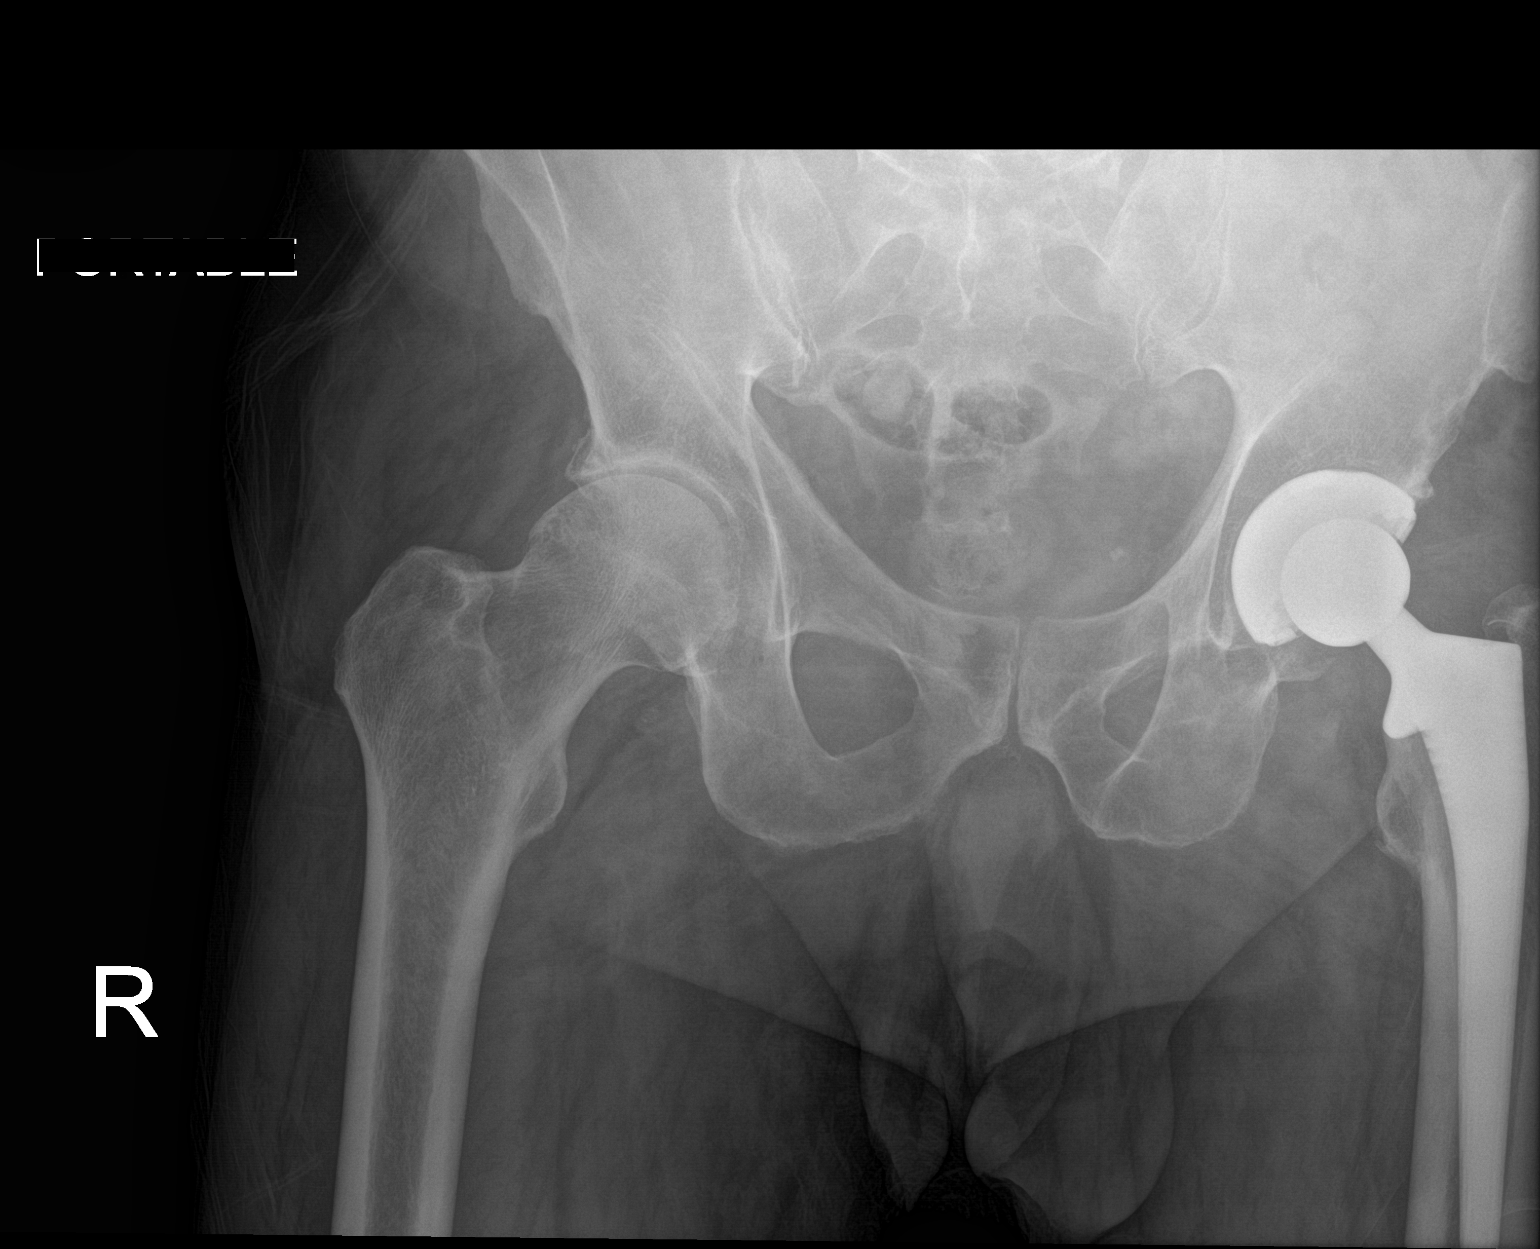

[2 of 2 positions shown; findings below may reference images not displayed]

FINDINGS: Left total hip arthroplasty with subcutaneous/joint air. Femoral
stem appears well seated. Mild degenerative changes in the right hip
and visualized portion of the spine.
IMPRESSION: 1. Left hip arthroplasty with expected postoperative findings.
2. Right hip osteoarthritis.

## 2019-02-21 DIAGNOSIS — G4733 Obstructive sleep apnea (adult) (pediatric): Secondary | ICD-10-CM | POA: Diagnosis not present

## 2019-04-19 DIAGNOSIS — Z96642 Presence of left artificial hip joint: Secondary | ICD-10-CM | POA: Diagnosis not present

## 2019-04-19 DIAGNOSIS — M1712 Unilateral primary osteoarthritis, left knee: Secondary | ICD-10-CM | POA: Diagnosis not present

## 2019-04-23 DIAGNOSIS — M79644 Pain in right finger(s): Secondary | ICD-10-CM | POA: Diagnosis not present

## 2019-04-23 DIAGNOSIS — M65311 Trigger thumb, right thumb: Secondary | ICD-10-CM | POA: Diagnosis not present

## 2019-04-23 DIAGNOSIS — M79645 Pain in left finger(s): Secondary | ICD-10-CM | POA: Diagnosis not present

## 2019-04-23 DIAGNOSIS — M65312 Trigger thumb, left thumb: Secondary | ICD-10-CM | POA: Diagnosis not present

## 2019-06-04 DIAGNOSIS — G4733 Obstructive sleep apnea (adult) (pediatric): Secondary | ICD-10-CM | POA: Diagnosis not present

## 2019-08-14 DIAGNOSIS — L570 Actinic keratosis: Secondary | ICD-10-CM | POA: Diagnosis not present

## 2019-08-14 DIAGNOSIS — D2239 Melanocytic nevi of other parts of face: Secondary | ICD-10-CM | POA: Diagnosis not present

## 2019-08-14 DIAGNOSIS — D225 Melanocytic nevi of trunk: Secondary | ICD-10-CM | POA: Diagnosis not present

## 2019-08-14 DIAGNOSIS — L821 Other seborrheic keratosis: Secondary | ICD-10-CM | POA: Diagnosis not present

## 2019-08-14 DIAGNOSIS — L820 Inflamed seborrheic keratosis: Secondary | ICD-10-CM | POA: Diagnosis not present

## 2019-08-31 DIAGNOSIS — R05 Cough: Secondary | ICD-10-CM | POA: Diagnosis not present

## 2019-08-31 DIAGNOSIS — J029 Acute pharyngitis, unspecified: Secondary | ICD-10-CM | POA: Diagnosis not present

## 2019-08-31 DIAGNOSIS — J069 Acute upper respiratory infection, unspecified: Secondary | ICD-10-CM | POA: Diagnosis not present

## 2019-08-31 DIAGNOSIS — R070 Pain in throat: Secondary | ICD-10-CM | POA: Diagnosis not present

## 2019-09-03 DIAGNOSIS — J321 Chronic frontal sinusitis: Secondary | ICD-10-CM | POA: Diagnosis not present

## 2019-09-04 DIAGNOSIS — G4733 Obstructive sleep apnea (adult) (pediatric): Secondary | ICD-10-CM | POA: Diagnosis not present

## 2019-09-09 DIAGNOSIS — Z0000 Encounter for general adult medical examination without abnormal findings: Secondary | ICD-10-CM | POA: Diagnosis not present

## 2019-09-09 DIAGNOSIS — K219 Gastro-esophageal reflux disease without esophagitis: Secondary | ICD-10-CM | POA: Diagnosis not present

## 2019-09-09 DIAGNOSIS — Z23 Encounter for immunization: Secondary | ICD-10-CM | POA: Diagnosis not present

## 2019-09-09 DIAGNOSIS — J301 Allergic rhinitis due to pollen: Secondary | ICD-10-CM | POA: Diagnosis not present

## 2019-09-09 DIAGNOSIS — E785 Hyperlipidemia, unspecified: Secondary | ICD-10-CM | POA: Diagnosis not present

## 2019-09-09 DIAGNOSIS — G4733 Obstructive sleep apnea (adult) (pediatric): Secondary | ICD-10-CM | POA: Diagnosis not present

## 2019-09-09 DIAGNOSIS — Z79899 Other long term (current) drug therapy: Secondary | ICD-10-CM | POA: Diagnosis not present

## 2019-09-09 DIAGNOSIS — E7800 Pure hypercholesterolemia, unspecified: Secondary | ICD-10-CM | POA: Diagnosis not present

## 2019-09-09 DIAGNOSIS — Z8546 Personal history of malignant neoplasm of prostate: Secondary | ICD-10-CM | POA: Diagnosis not present

## 2019-09-13 DIAGNOSIS — Z8546 Personal history of malignant neoplasm of prostate: Secondary | ICD-10-CM | POA: Diagnosis not present

## 2019-09-23 DIAGNOSIS — H5203 Hypermetropia, bilateral: Secondary | ICD-10-CM | POA: Diagnosis not present

## 2019-09-23 DIAGNOSIS — H524 Presbyopia: Secondary | ICD-10-CM | POA: Diagnosis not present

## 2019-09-23 DIAGNOSIS — H2513 Age-related nuclear cataract, bilateral: Secondary | ICD-10-CM | POA: Diagnosis not present

## 2019-09-23 DIAGNOSIS — H52209 Unspecified astigmatism, unspecified eye: Secondary | ICD-10-CM | POA: Diagnosis not present

## 2019-10-21 DIAGNOSIS — G4733 Obstructive sleep apnea (adult) (pediatric): Secondary | ICD-10-CM | POA: Diagnosis not present

## 2019-12-17 DIAGNOSIS — J069 Acute upper respiratory infection, unspecified: Secondary | ICD-10-CM | POA: Diagnosis not present

## 2019-12-17 DIAGNOSIS — Z20828 Contact with and (suspected) exposure to other viral communicable diseases: Secondary | ICD-10-CM | POA: Diagnosis not present

## 2020-01-21 DIAGNOSIS — G4733 Obstructive sleep apnea (adult) (pediatric): Secondary | ICD-10-CM | POA: Diagnosis not present

## 2020-04-09 DIAGNOSIS — M1712 Unilateral primary osteoarthritis, left knee: Secondary | ICD-10-CM | POA: Diagnosis not present

## 2020-05-27 DIAGNOSIS — G4733 Obstructive sleep apnea (adult) (pediatric): Secondary | ICD-10-CM | POA: Diagnosis not present

## 2020-06-16 DIAGNOSIS — L821 Other seborrheic keratosis: Secondary | ICD-10-CM | POA: Diagnosis not present

## 2020-06-16 DIAGNOSIS — L570 Actinic keratosis: Secondary | ICD-10-CM | POA: Diagnosis not present

## 2020-06-16 DIAGNOSIS — D2239 Melanocytic nevi of other parts of face: Secondary | ICD-10-CM | POA: Diagnosis not present

## 2020-06-16 DIAGNOSIS — D225 Melanocytic nevi of trunk: Secondary | ICD-10-CM | POA: Diagnosis not present

## 2020-07-20 DIAGNOSIS — Z0184 Encounter for antibody response examination: Secondary | ICD-10-CM | POA: Diagnosis not present

## 2020-07-22 DIAGNOSIS — G4733 Obstructive sleep apnea (adult) (pediatric): Secondary | ICD-10-CM | POA: Diagnosis not present

## 2020-09-10 DIAGNOSIS — K219 Gastro-esophageal reflux disease without esophagitis: Secondary | ICD-10-CM | POA: Diagnosis not present

## 2020-09-10 DIAGNOSIS — G4733 Obstructive sleep apnea (adult) (pediatric): Secondary | ICD-10-CM | POA: Diagnosis not present

## 2020-09-10 DIAGNOSIS — Z79899 Other long term (current) drug therapy: Secondary | ICD-10-CM | POA: Diagnosis not present

## 2020-09-10 DIAGNOSIS — M1712 Unilateral primary osteoarthritis, left knee: Secondary | ICD-10-CM | POA: Diagnosis not present

## 2020-09-10 DIAGNOSIS — J301 Allergic rhinitis due to pollen: Secondary | ICD-10-CM | POA: Diagnosis not present

## 2020-09-10 DIAGNOSIS — Z8546 Personal history of malignant neoplasm of prostate: Secondary | ICD-10-CM | POA: Diagnosis not present

## 2020-09-10 DIAGNOSIS — Z23 Encounter for immunization: Secondary | ICD-10-CM | POA: Diagnosis not present

## 2020-09-10 DIAGNOSIS — Z0000 Encounter for general adult medical examination without abnormal findings: Secondary | ICD-10-CM | POA: Diagnosis not present

## 2020-09-10 DIAGNOSIS — E7800 Pure hypercholesterolemia, unspecified: Secondary | ICD-10-CM | POA: Diagnosis not present

## 2020-09-29 DIAGNOSIS — H5203 Hypermetropia, bilateral: Secondary | ICD-10-CM | POA: Diagnosis not present

## 2020-09-29 DIAGNOSIS — H524 Presbyopia: Secondary | ICD-10-CM | POA: Diagnosis not present

## 2020-09-29 DIAGNOSIS — H52223 Regular astigmatism, bilateral: Secondary | ICD-10-CM | POA: Diagnosis not present

## 2020-10-22 DIAGNOSIS — G4733 Obstructive sleep apnea (adult) (pediatric): Secondary | ICD-10-CM | POA: Diagnosis not present

## 2020-11-13 DIAGNOSIS — Z8546 Personal history of malignant neoplasm of prostate: Secondary | ICD-10-CM | POA: Diagnosis not present

## 2021-01-18 DIAGNOSIS — G4733 Obstructive sleep apnea (adult) (pediatric): Secondary | ICD-10-CM | POA: Diagnosis not present

## 2021-03-09 DIAGNOSIS — M1712 Unilateral primary osteoarthritis, left knee: Secondary | ICD-10-CM | POA: Diagnosis not present

## 2021-04-02 DIAGNOSIS — M1712 Unilateral primary osteoarthritis, left knee: Secondary | ICD-10-CM | POA: Diagnosis not present

## 2021-04-09 DIAGNOSIS — Z6831 Body mass index (BMI) 31.0-31.9, adult: Secondary | ICD-10-CM | POA: Diagnosis not present

## 2021-04-09 DIAGNOSIS — Z01818 Encounter for other preprocedural examination: Secondary | ICD-10-CM | POA: Diagnosis not present

## 2021-04-09 DIAGNOSIS — Z7901 Long term (current) use of anticoagulants: Secondary | ICD-10-CM | POA: Diagnosis not present

## 2021-05-06 DIAGNOSIS — G4733 Obstructive sleep apnea (adult) (pediatric): Secondary | ICD-10-CM | POA: Diagnosis not present

## 2021-06-11 DIAGNOSIS — M1712 Unilateral primary osteoarthritis, left knee: Secondary | ICD-10-CM | POA: Diagnosis not present

## 2021-06-22 NOTE — H&P (Signed)
TOTAL KNEE ADMISSION H&P  Patient is being admitted for left total knee arthroplasty.  Subjective:  Chief Complaint: Left knee pain.  HPI: Benjamin Rivas, 75 y.o. male has a history of pain and functional disability in the left knee due to arthritis and has failed non-surgical conservative treatments for greater than 12 weeks to include corticosteriod injections and activity modification. Onset of symptoms was gradual, starting  several  years ago with gradually worsening course since that time. The patient noted prior procedures on the knee to include  arthroscopy and menisectomy on the left knee.  Patient currently rates pain in the left knee at 9 out of 10 with activity. Patient has night pain, worsening of pain with activity and weight bearing, pain with passive range of motion, and crepitus. Patient has evidence of  bone-on-bone arthritis in the patellofemoral compartment, near bone-on-bone in the medial compartment  by imaging studies. There is no active infection.  Patient Active Problem List   Diagnosis Date Noted   OA (osteoarthritis) of hip 01/11/2017   Prostate cancer ([place]) 04/20/2015    Past Medical History:  Diagnosis Date   Arthritis    L knee   Bone spur    left hip   Cancer ([place])    prostate cancer   Complication of anesthesia    "stopped breathing with knee surgery-with following prostate surgery"   ED (erectile dysfunction)    GERD (gastroesophageal reflux disease)    Ringing in ears    Seasonal allergies    Sleep apnea    cpap    Past Surgical History:  Procedure Laterality Date   COLONOSCOPY  11/19/2012   tubular adenoma   ESOPHAGOGASTRODUODENOSCOPY  11/19/2012   Small hiatal hernia.    HIP SURGERY Left age 66   KNEE ARTHROSCOPY Left    LYMPHADENECTOMY Bilateral 04/20/2015   Procedure: PELVIC LYMPHADENECTOMY;  Surgeon: Raynelle Bring, MD;  Location: [facility];  Service: Urology;  Laterality: Bilateral;   PROSTATE BIOPSY     x2   ROBOT ASSISTED LAPAROSCOPIC  RADICAL PROSTATECTOMY N/A 04/20/2015   Procedure: ROBOTIC ASSISTED LAPAROSCOPIC RADICAL PROSTATECTOMY LEVEL 2;  Surgeon: Raynelle Bring, MD;  Location: [facility];  Service: Urology;  Laterality: N/A;   ROTATOR CUFF REPAIR Right    TOTAL HIP ARTHROPLASTY Left 01/11/2017   Procedure: LEFT TOTAL HIP ARTHROPLASTY ANTERIOR APPROACH;  Surgeon: Gaynelle Arabian, MD;  Location: [facility];  Service: Orthopedics;  Laterality: Left;   UPPER GI ENDOSCOPY  11/19/2012   small hiatal hernia   VASECTOMY  1974   WISDOM TOOTH EXTRACTION      Prior to Admission medications   Medication Sig Start Date End Date Taking? Authorizing Provider  aspirin 81 MG chewable tablet Chew 81 mg by mouth daily.    [provider]  esomeprazole (NEXIUM) 20 MG capsule Take 20 mg by mouth daily at 12 noon.    [provider]  Melatonin 5 MG TABS Take 5 mg by mouth at bedtime.    [provider]  montelukast (SINGULAIR) 10 MG tablet Take 10 mg by mouth every morning.    [provider]  Polyvinyl Alcohol-Povidone (REFRESH OP) Apply 1 drop to eye daily as needed (dry eyes).    [provider]  Triamcinolone Acetonide (NASACORT AQ NA) Place 1 spray into the nose daily.     [provider]    No Known Allergies  Social History   Socioeconomic History   Marital status: Married    Spouse name: Not  on file   Number of children: Not on file   Years of education: Not on file   Highest education level: Not on file  Occupational History   Not on file  Tobacco Use   Smoking status: Never   Smokeless tobacco: Never  Vaping Use   Vaping Use: Never used  Substance and Sexual Activity   Alcohol use: No   Drug use: No   Sexual activity: Not on file  Other Topics Concern   Not on file  Social History Narrative   Not on file   Social Determinants of Health   Financial Resource Strain: Not on file  Food Insecurity: Not on file  Transportation Needs: Not on file  Physical Activity:  Not on file  Stress: Not on file  Social Connections: Not on file  Intimate Partner Violence: Not on file    Tobacco Use: Low Risk    Smoking Tobacco Use: Never   Smokeless Tobacco Use: Never   Social History   Substance and Sexual Activity  Alcohol Use No    No family history on file.  Review of Systems  Constitutional:  Negative for chills and fever.  HENT:  Negative for congestion, sore throat and tinnitus.   Eyes:  Negative for double vision, photophobia and pain.  Respiratory:  Negative for cough, shortness of breath and wheezing.   Cardiovascular:  Negative for chest pain, palpitations and orthopnea.  Gastrointestinal:  Negative for heartburn, nausea and vomiting.  Genitourinary:  Negative for dysuria, frequency and urgency.  Musculoskeletal:  Positive for joint pain.  Neurological:  Negative for dizziness, weakness and headaches.   Objective:  Physical Exam: Well nourished and well developed.  General: Alert and oriented x3, cooperative and pleasant, no acute distress.  Head: normocephalic, atraumatic, neck supple.  Eyes: EOMI.  Respiratory: breath sounds clear in all fields, no wheezing, rales, or rhonchi. Cardiovascular: Regular rate and rhythm, no murmurs, gallops or rubs.  Abdomen: non-tender to palpation and soft, normoactive bowel sounds. Musculoskeletal:   Left Knee Exam:   No effusion present. No swelling present.   Range of motion: 0 to 115 degrees.   Moderate crepitus on range of motion of the knee.   Positive medial joint line tenderness.   No lateral joint line tenderness.   The knee is stable.  Calves soft and nontender. Motor function intact in LE. Strength 5/5 LE bilaterally. Neuro: Distal pulses 2+. Sensation to light touch intact in LE.  Imaging Review Plain radiographs demonstrate severe degenerative joint disease of the left knee. The overall alignment is neutral. The bone quality appears to be adequate for age and reported activity  level.  Assessment/Plan:  End stage arthritis, left knee   The patient history, physical examination, clinical judgment of the provider and imaging studies are consistent with end stage degenerative joint disease of the left knee and total knee arthroplasty is deemed medically necessary. The treatment options including medical management, injection therapy arthroscopy and arthroplasty were discussed at length. The risks and benefits of total knee arthroplasty were presented and reviewed. The risks due to aseptic loosening, infection, stiffness, patella tracking problems, thromboembolic complications and other imponderables were discussed. The patient acknowledged the explanation, agreed to proceed with the plan and consent was signed. Patient is being admitted for inpatient treatment for surgery, pain control, PT, OT, prophylactic antibiotics, VTE prophylaxis, progressive ambulation and ADLs and discharge planning. The patient is planning to be discharged  home .   Patient's anticipated LOS is less than  2 midnights, meeting these requirements: - Younger than 62 - Lives within 1 hour of care - Has a competent adult at home to recover with post-op recover - NO history of  - Chronic pain requiring opiods  - Diabetes  - Coronary Artery Disease  - Heart failure  - Heart attack  - Stroke  - DVT/VTE  - Cardiac arrhythmia  - Respiratory Failure/COPD  - Renal failure  - Anemia  - Advanced Liver disease  Therapy Plans: Outpatient therapy at [facility] Disposition: Home with wife Planned DVT Prophylaxis: Xarelto 10 mg QD  DME Needed: None PCP: Bertram Millard, MD (appt 04/09/21) TXA: IV Allergies: NKDA Anesthesia Concerns: Stopped breathing during previous knee arthroscopy BMI: 30.6 Last HgbA1c: 6.4% Pharmacy: [facility] ([address])  Other:  - Taking clearance form by Dr. Laurelyn Sickle office - Hx prostate CA - Bundle patient  - Patient was instructed on what medications to stop prior to surgery. -  Follow-up visit in 2 weeks with Dr. Wynelle Link - Begin physical therapy following surgery - Pre-operative lab work as pre-surgical testing - Prescriptions will be provided in hospital at time of discharge  Theresa Duty, PA-C Orthopedic Surgery [facility] [place]

## 2021-07-05 NOTE — Progress Notes (Signed)
DUE TO COVID-19 ONLY ONE VISITOR IS ALLOWED TO COME WITH YOU AND STAY IN THE WAITING ROOM ONLY DURING PRE OP AND PROCEDURE DAY OF SURGERY.  2 VISITOR  MAY VISIT WITH YOU AFTER SURGERY IN YOUR PRIVATE ROOM DURING VISITING HOURS ONLY!  YOU NEED TO HAVE A COVID 19 TEST ON______AME'@_'$  '@_from'$  8am-3pm _____, THIS TEST MUST BE DONE BEFORE SURGERY,  Covid test is done at [place], [place] [address].  This is a drive thru.  No appt required. Please see map.                 Your procedure is scheduled on:            07/12/2021   Report to [facility] Main  Entrance   Report to admitting at     0700AM     Call this number if you have problems the morning of surgery [phone]    REMEMBER: NO  SOLID FOOD CANDY OR GUM AFTER MIDNIGHT. CLEAR LIQUIDS UNTIL      0615am       . NOTHING BY MOUTH EXCEPT CLEAR LIQUIDS UNTIL   0615am  . PLEASE FINISH ENSURE DRINK PER SURGEON ORDER  WHICH NEEDS TO BE COMPLETED AT    0615am     CLEAR LIQUID DIET   Foods Allowed                                                                    Coffee and tea, regular and decaf                            Fruit ices (not with fruit pulp)                                      Iced Popsicles                                    Carbonated beverages, regular and diet                                    Cranberry, grape and apple juices Sports drinks like Gatorade Lightly seasoned clear broth or consume(fat free) Sugar, honey syrup ___________________________________________________________________      BRUSH YOUR TEETH MORNING OF SURGERY AND RINSE YOUR MOUTH OUT, NO CHEWING GUM CANDY OR MINTS.     Take these medicines the morning of surgery with A SIP OF WATER:  None  DO NOT TAKE ANY DIABETIC MEDICATIONS DAY OF YOUR SURGERY                               You may not have any metal on your body including hair pins and              piercings  Do not wear jewelry, make-up, lotions, powders or  perfumes, deodorant  Do not wear nail polish on your fingernails.  Do not shave  48 hours prior to surgery.              Men may shave face and neck.   Do not bring valuables to the hospital. [place].  Contacts, dentures or bridgework may not be worn into surgery.  Leave suitcase in the car. After surgery it may be brought to your room.     Patients discharged the day of surgery will not be allowed to drive home. IF YOU ARE HAVING SURGERY AND GOING HOME THE SAME DAY, YOU MUST HAVE AN ADULT TO DRIVE YOU HOME AND BE WITH YOU FOR 24 HOURS. YOU MAY GO HOME BY TAXI OR UBER OR ORTHERWISE, BUT AN ADULT MUST ACCOMPANY YOU HOME AND STAY WITH YOU FOR 24 HOURS.  Name and phone number of your driver:  Special Instructions: N/A              Please read over the following fact sheets you were given: _____________________________________________________________________  [facility] - Preparing for Surgery Before surgery, you can play an important role.  Because skin is not sterile, your skin needs to be as free of germs as possible.  You can reduce the number of germs on your skin by washing with CHG (chlorahexidine gluconate) soap before surgery.  CHG is an antiseptic cleaner which kills germs and bonds with the skin to continue killing germs even after washing. Please DO NOT use if you have an allergy to CHG or antibacterial soaps.  If your skin becomes reddened/irritated stop using the CHG and inform your nurse when you arrive at Short Stay. Do not shave (including legs and underarms) for at least 48 hours prior to the first CHG shower.  You may shave your face/neck. Please follow these instructions carefully:  1.  Shower with CHG Soap the night before surgery and the  morning of Surgery.  2.  If you choose to wash your hair, wash your hair first as usual with your  normal  shampoo.  3.  After you shampoo, rinse your hair and body thoroughly  to remove the  shampoo.                           4.  Use CHG as you would any other liquid soap.  You can apply chg directly  to the skin and wash                       Gently with a scrungie or clean washcloth.  5.  Apply the CHG Soap to your body ONLY FROM THE NECK DOWN.   Do not use on face/ open                           Wound or open sores. Avoid contact with eyes, ears mouth and genitals (private parts).                       Wash face,  Genitals (private parts) with your normal soap.             6.  Wash thoroughly, paying special attention to the area where your surgery  will be performed.  7.  Thoroughly rinse your body with  warm water from the neck down.  8.  DO NOT shower/wash with your normal soap after using and rinsing off  the CHG Soap.                9.  Pat yourself dry with a clean towel.            10.  Wear clean pajamas.            11.  Place clean sheets on your bed the night of your first shower and do not  sleep with pets. Day of Surgery : Do not apply any lotions/deodorants the morning of surgery.  Please wear clean clothes to the hospital/surgery center.  FAILURE TO FOLLOW THESE INSTRUCTIONS MAY RESULT IN THE CANCELLATION OF YOUR SURGERY PATIENT SIGNATURE_________________________________  NURSE SIGNATURE__________________________________  ________________________________________________________________________

## 2021-07-07 ENCOUNTER — Encounter (HOSPITAL_COMMUNITY): Payer: Self-pay

## 2021-07-07 ENCOUNTER — Other Ambulatory Visit: Payer: Self-pay

## 2021-07-07 ENCOUNTER — Encounter (HOSPITAL_COMMUNITY)
Admission: RE | Admit: 2021-07-07 | Discharge: 2021-07-07 | Disposition: A | Discharge status: Home / Self Care | Payer: Medicare HMO | Source: Ambulatory Visit | Attending: Orthopedic Surgery | Admitting: Orthopedic Surgery

## 2021-07-07 DIAGNOSIS — Z01812 Encounter for preprocedural laboratory examination: Secondary | ICD-10-CM | POA: Diagnosis not present

## 2021-07-07 HISTORY — DX: Pneumonia, unspecified organism: J18.9

## 2021-07-07 LAB — CBC
HCT: 48.4 % (ref 39.0–52.0)
Hemoglobin: 15.6 g/dL (ref 13.0–17.0)
MCH: 31 pg (ref 26.0–34.0)
MCHC: 32.2 g/dL (ref 30.0–36.0)
MCV: 96.2 fL (ref 80.0–100.0)
Platelets: 216 10*3/uL (ref 150–400)
RBC: 5.03 MIL/uL (ref 4.22–5.81)
RDW: 13.1 % (ref 11.5–15.5)
WBC: 7.6 10*3/uL (ref 4.0–10.5)
nRBC: 0 % (ref 0.0–0.2)

## 2021-07-07 LAB — COMPREHENSIVE METABOLIC PANEL
ALT: 21 U/L (ref 0–44)
AST: 23 U/L (ref 15–41)
Albumin: 4.4 g/dL (ref 3.5–5.0)
Alkaline Phosphatase: 63 U/L (ref 38–126)
Anion gap: 9 (ref 5–15)
BUN: 14 mg/dL (ref 8–23)
CO2: 28 mmol/L (ref 22–32)
Calcium: 9.7 mg/dL (ref 8.9–10.3)
Chloride: 103 mmol/L (ref 98–111)
Creatinine, Ser: 1.14 mg/dL (ref 0.61–1.24)
GFR, Estimated: >60 mL/min (ref >60)
Glucose, Bld: 107 mg/dL — ABNORMAL HIGH (ref 70–99)
Potassium: 4.6 mmol/L (ref 3.5–5.1)
Sodium: 140 mmol/L (ref 135–145)
Total Bilirubin: 1.3 mg/dL — ABNORMAL HIGH (ref 0.3–1.2)
Total Protein: 7.4 g/dL (ref 6.5–8.1)

## 2021-07-07 LAB — SURGICAL PCR SCREEN
MRSA, PCR: NEGATIVE
Staphylococcus aureus: NEGATIVE

## 2021-07-07 LAB — TYPE AND SCREEN
ABO/RH(D): A POS
Antibody Screen: NEGATIVE

## 2021-07-07 LAB — PROTIME-INR
INR: 0.9 (ref 0.8–1.2)
Prothrombin Time: 12.5 seconds (ref 11.4–15.2)

## 2021-07-07 NOTE — Progress Notes (Addendum)
Anesthesia Review:  PCP: vDR Bertram Millard Clearance dated 06/24/21 on chart  Requested LOV note.Dated 04/09/21 on chart.  Cardiologist : Chest x-ray : EKG : Echo : Stress test: Cardiac Cath :  Activity level: can do a flight of stairs without difficulty  Sleep Study/ CPAP : has cpap  Fasting Blood Sugar :      / Checks Blood Sugar -- times a day:   Blood Thinner/ Instructions /Last Dose: ASA / Instructions/ Last Dose :   Hgba1c- 04/09/21- 6.4 PT reports he has lost 16 lbs since then.  Pt reports " stopped breathing ' during knee surgery here at [facility] 20 years ago per pt .  No problems since.

## 2021-07-15 ENCOUNTER — Other Ambulatory Visit: Payer: Self-pay | Admitting: Orthopedic Surgery

## 2021-07-15 LAB — SARS CORONAVIRUS 2 (TAT 6-24 HRS): SARS Coronavirus 2: NEGATIVE

## 2021-07-18 MED ORDER — BUPIVACAINE LIPOSOME 1.3 % IJ SUSP
20.0000 mL | Freq: Once | INTRAMUSCULAR | Status: AC
  Filled 2021-07-18: qty 20

## 2021-07-18 NOTE — Anesthesia Preprocedure Evaluation (Addendum)
Anesthesia Evaluation  Patient identified by MRN, date of birth, ID band Patient awake    Reviewed: Allergy & Precautions, NPO status , Patient's Chart, lab work & pertinent test results  History of Anesthesia Complications Negative for: history of anesthetic complications  Airway Mallampati: II  TM Distance: >3 FB Neck ROM: Full    Dental  (+)    Pulmonary sleep apnea ,    Pulmonary exam normal        Cardiovascular negative cardio ROS Normal cardiovascular exam     Neuro/Psych negative neurological ROS  negative psych ROS   GI/Hepatic Neg liver ROS, GERD  Medicated and Controlled,  Endo/Other  negative endocrine ROS  Renal/GU negative Renal ROS   Prostate ca    Musculoskeletal  (+) Arthritis ,   Abdominal   Peds  Hematology negative hematology ROS (+)   Anesthesia Other Findings Day of surgery medications reviewed with patient.  Reproductive/Obstetrics negative OB ROS                            Anesthesia Physical Anesthesia Plan  ASA: 2  Anesthesia Plan: Spinal   Post-op Pain Management:  Regional for Post-op pain   Induction:   PONV Risk Score and Plan: 2 and Treatment may vary due to age or medical condition, Ondansetron, Propofol infusion and Dexamethasone  Airway Management Planned: Natural Airway and Simple Face Mask  Additional Equipment: None  Intra-op Plan:   Post-operative Plan:   Informed Consent: I have reviewed the patients History and Physical, chart, labs and discussed the procedure including the risks, benefits and alternatives for the proposed anesthesia with the patient or authorized representative who has indicated his/her understanding and acceptance.       Plan Discussed with: CRNA  Anesthesia Plan Comments:        Anesthesia Quick Evaluation

## 2021-07-19 ENCOUNTER — Ambulatory Visit (HOSPITAL_COMMUNITY): Payer: Medicare HMO | Admitting: Anesthesiology

## 2021-07-19 ENCOUNTER — Other Ambulatory Visit: Payer: Self-pay

## 2021-07-19 ENCOUNTER — Observation Stay (HOSPITAL_COMMUNITY)
Admission: RE | Admit: 2021-07-19 | Discharge: 2021-07-20 | Disposition: A | Discharge status: Home / Self Care | Payer: Medicare HMO | Attending: Orthopedic Surgery | Admitting: Orthopedic Surgery

## 2021-07-19 ENCOUNTER — Encounter (HOSPITAL_COMMUNITY)
Admission: RE | Disposition: A | Discharge status: Home / Self Care | Payer: Self-pay | Source: Home / Self Care | Attending: Orthopedic Surgery

## 2021-07-19 ENCOUNTER — Encounter (HOSPITAL_COMMUNITY): Payer: Self-pay | Admitting: Orthopedic Surgery

## 2021-07-19 ENCOUNTER — Ambulatory Visit (HOSPITAL_COMMUNITY): Payer: Medicare HMO | Admitting: Physician Assistant

## 2021-07-19 DIAGNOSIS — Z7982 Long term (current) use of aspirin: Secondary | ICD-10-CM | POA: Diagnosis not present

## 2021-07-19 DIAGNOSIS — Z96642 Presence of left artificial hip joint: Secondary | ICD-10-CM | POA: Diagnosis not present

## 2021-07-19 DIAGNOSIS — Z9989 Dependence on other enabling machines and devices: Secondary | ICD-10-CM | POA: Diagnosis not present

## 2021-07-19 DIAGNOSIS — M1712 Unilateral primary osteoarthritis, left knee: Principal | ICD-10-CM | POA: Diagnosis present

## 2021-07-19 DIAGNOSIS — M1710 Unilateral primary osteoarthritis, unspecified knee: Secondary | ICD-10-CM | POA: Diagnosis present

## 2021-07-19 DIAGNOSIS — G4730 Sleep apnea, unspecified: Secondary | ICD-10-CM | POA: Diagnosis not present

## 2021-07-19 DIAGNOSIS — Z79899 Other long term (current) drug therapy: Secondary | ICD-10-CM | POA: Insufficient documentation

## 2021-07-19 DIAGNOSIS — Z8546 Personal history of malignant neoplasm of prostate: Secondary | ICD-10-CM | POA: Diagnosis not present

## 2021-07-19 DIAGNOSIS — M179 Osteoarthritis of knee, unspecified: Secondary | ICD-10-CM | POA: Diagnosis present

## 2021-07-19 DIAGNOSIS — G8918 Other acute postprocedural pain: Secondary | ICD-10-CM | POA: Diagnosis not present

## 2021-07-19 HISTORY — PX: TOTAL KNEE ARTHROPLASTY: SHX125

## 2021-07-19 LAB — GLUCOSE, CAPILLARY: Glucose-Capillary: 193 mg/dL — ABNORMAL HIGH (ref 70–99)

## 2021-07-19 SURGERY — ARTHROPLASTY, KNEE, TOTAL
Anesthesia: Spinal | Site: Knee | Laterality: Left

## 2021-07-19 MED ORDER — METHOCARBAMOL 500 MG PO TABS
500.0000 mg | ORAL_TABLET | Freq: Four times a day (QID) | ORAL | Status: DC | PRN
  Administered 2021-07-19 – 2021-07-20 (×2): 500 mg via ORAL
  Filled 2021-07-19 (×2): qty 1

## 2021-07-19 MED ORDER — FLEET ENEMA 7-19 GM/118ML RE ENEM: 1.0000 | ENEMA | Freq: Once | RECTAL | Status: DC | PRN

## 2021-07-19 MED ORDER — OXYCODONE HCL 5 MG PO TABS: 5.0000 mg | ORAL_TABLET | Freq: Once | ORAL | Status: DC | PRN

## 2021-07-19 MED ORDER — BISACODYL 10 MG RE SUPP: 10.0000 mg | Freq: Every day | RECTAL | Status: DC | PRN

## 2021-07-19 MED ORDER — TRAMADOL HCL 50 MG PO TABS
50.0000 mg | ORAL_TABLET | Freq: Four times a day (QID) | ORAL | Status: DC | PRN
  Administered 2021-07-19: 50 mg via ORAL
  Administered 2021-07-20 (×2): 100 mg via ORAL
  Filled 2021-07-19: qty 1
  Filled 2021-07-19 (×2): qty 2

## 2021-07-19 MED ORDER — SODIUM CHLORIDE 0.9 % IV SOLN: INTRAVENOUS | Status: DC

## 2021-07-19 MED ORDER — ONDANSETRON HCL 4 MG/2ML IJ SOLN
INTRAMUSCULAR | Status: DC | PRN
  Administered 2021-07-19: 4 mg via INTRAVENOUS

## 2021-07-19 MED ORDER — POVIDONE-IODINE 10 % EX SWAB
2.0000 "application " | Freq: Once | CUTANEOUS | Status: AC
  Administered 2021-07-19: 2 via TOPICAL

## 2021-07-19 MED ORDER — ONDANSETRON HCL 4 MG/2ML IJ SOLN: 4.0000 mg | Freq: Four times a day (QID) | INTRAMUSCULAR | Status: DC | PRN

## 2021-07-19 MED ORDER — ONDANSETRON HCL 4 MG PO TABS: 4.0000 mg | ORAL_TABLET | Freq: Four times a day (QID) | ORAL | Status: DC | PRN

## 2021-07-19 MED ORDER — CEFAZOLIN SODIUM-DEXTROSE 2-4 GM/100ML-% IV SOLN
2.0000 g | INTRAVENOUS | Status: AC
  Administered 2021-07-19: 2 g via INTRAVENOUS
  Filled 2021-07-19: qty 100

## 2021-07-19 MED ORDER — FENTANYL CITRATE (PF) 100 MCG/2ML IJ SOLN
50.0000 ug | INTRAMUSCULAR | Status: DC
Start: 2021-07-19 — End: 2021-07-19
  Administered 2021-07-19: 50 ug via INTRAVENOUS
  Filled 2021-07-19: qty 2

## 2021-07-19 MED ORDER — METOCLOPRAMIDE HCL 5 MG PO TABS: 5.0000 mg | ORAL_TABLET | Freq: Three times a day (TID) | ORAL | Status: DC | PRN

## 2021-07-19 MED ORDER — DEXAMETHASONE SODIUM PHOSPHATE 10 MG/ML IJ SOLN
INTRAMUSCULAR | Status: DC | PRN
  Administered 2021-07-19: 10 mg via INTRAVENOUS

## 2021-07-19 MED ORDER — TRANEXAMIC ACID-NACL 1000-0.7 MG/100ML-% IV SOLN
1000.0000 mg | INTRAVENOUS | Status: AC
  Administered 2021-07-19: 1000 mg via INTRAVENOUS
  Filled 2021-07-19: qty 100

## 2021-07-19 MED ORDER — MIDAZOLAM HCL 2 MG/2ML IJ SOLN
1.0000 mg | INTRAMUSCULAR | Status: DC
  Filled 2021-07-19: qty 2

## 2021-07-19 MED ORDER — POLYETHYLENE GLYCOL 3350 17 G PO PACK: 17.0000 g | PACK | Freq: Every day | ORAL | Status: DC | PRN

## 2021-07-19 MED ORDER — DEXAMETHASONE SODIUM PHOSPHATE 10 MG/ML IJ SOLN
10.0000 mg | Freq: Once | INTRAMUSCULAR | Status: AC
  Administered 2021-07-20: 10 mg via INTRAVENOUS
  Filled 2021-07-19: qty 1

## 2021-07-19 MED ORDER — BUPIVACAINE LIPOSOME 1.3 % IJ SUSP
INTRAMUSCULAR | Status: DC | PRN
  Administered 2021-07-19: 20 mL

## 2021-07-19 MED ORDER — STERILE WATER FOR IRRIGATION IR SOLN
Status: DC | PRN
  Administered 2021-07-19: 2000 mL

## 2021-07-19 MED ORDER — SODIUM CHLORIDE (PF) 0.9 % IJ SOLN
INTRAMUSCULAR | Status: DC | PRN
  Administered 2021-07-19: 60 mL

## 2021-07-19 MED ORDER — BUPIVACAINE IN DEXTROSE 0.75-8.25 % IT SOLN
INTRATHECAL | Status: DC | PRN
  Administered 2021-07-19: 1.6 mL via INTRATHECAL

## 2021-07-19 MED ORDER — ACETAMINOPHEN 500 MG PO TABS
1000.0000 mg | ORAL_TABLET | Freq: Four times a day (QID) | ORAL | Status: AC
  Administered 2021-07-19 – 2021-07-20 (×4): 1000 mg via ORAL
  Filled 2021-07-19 (×4): qty 2

## 2021-07-19 MED ORDER — MENTHOL 3 MG MT LOZG: 1.0000 | LOZENGE | OROMUCOSAL | Status: DC | PRN

## 2021-07-19 MED ORDER — RIVAROXABAN 10 MG PO TABS
10.0000 mg | ORAL_TABLET | Freq: Every day | ORAL | Status: DC
  Administered 2021-07-20: 10 mg via ORAL
  Filled 2021-07-19: qty 1

## 2021-07-19 MED ORDER — CLONIDINE HCL (ANALGESIA) 100 MCG/ML EP SOLN
EPIDURAL | Status: DC | PRN
  Administered 2021-07-19: 100 ug

## 2021-07-19 MED ORDER — 0.9 % SODIUM CHLORIDE (POUR BTL) OPTIME
TOPICAL | Status: DC | PRN
  Administered 2021-07-19: 1000 mL

## 2021-07-19 MED ORDER — BUPIVACAINE-EPINEPHRINE (PF) 0.5% -1:200000 IJ SOLN
INTRAMUSCULAR | Status: DC | PRN
  Administered 2021-07-19: 15 mL via PERINEURAL

## 2021-07-19 MED ORDER — PHENOL 1.4 % MT LIQD: 1.0000 | OROMUCOSAL | Status: DC | PRN

## 2021-07-19 MED ORDER — PROPOFOL 500 MG/50ML IV EMUL
INTRAVENOUS | Status: DC | PRN
  Administered 2021-07-19: 80 ug/kg/min via INTRAVENOUS

## 2021-07-19 MED ORDER — OXYCODONE HCL 5 MG/5ML PO SOLN: 5.0000 mg | Freq: Once | ORAL | Status: DC | PRN

## 2021-07-19 MED ORDER — METHOCARBAMOL 500 MG IVPB - SIMPLE MED
500.0000 mg | Freq: Four times a day (QID) | INTRAVENOUS | Status: DC | PRN
  Filled 2021-07-19: qty 50

## 2021-07-19 MED ORDER — FAMOTIDINE 20 MG PO TABS: 20.0000 mg | ORAL_TABLET | Freq: Every day | ORAL | Status: DC | PRN

## 2021-07-19 MED ORDER — SODIUM CHLORIDE (PF) 0.9 % IJ SOLN
INTRAMUSCULAR | Status: AC
  Filled 2021-07-19: qty 10

## 2021-07-19 MED ORDER — ACETAMINOPHEN 10 MG/ML IV SOLN
1000.0000 mg | Freq: Four times a day (QID) | INTRAVENOUS | Status: DC
  Administered 2021-07-19: 1000 mg via INTRAVENOUS
  Filled 2021-07-19: qty 100

## 2021-07-19 MED ORDER — OXYCODONE HCL 5 MG PO TABS
5.0000 mg | ORAL_TABLET | ORAL | Status: DC | PRN
  Administered 2021-07-19 – 2021-07-20 (×2): 5 mg via ORAL
  Administered 2021-07-20: 10 mg via ORAL
  Filled 2021-07-19: qty 1
  Filled 2021-07-19: qty 2
  Filled 2021-07-19: qty 1

## 2021-07-19 MED ORDER — FENTANYL CITRATE (PF) 100 MCG/2ML IJ SOLN: 25.0000 ug | INTRAMUSCULAR | Status: DC | PRN

## 2021-07-19 MED ORDER — METOCLOPRAMIDE HCL 5 MG/ML IJ SOLN: 5.0000 mg | Freq: Three times a day (TID) | INTRAMUSCULAR | Status: DC | PRN

## 2021-07-19 MED ORDER — MORPHINE SULFATE (PF) 2 MG/ML IV SOLN: 0.5000 mg | INTRAVENOUS | Status: DC | PRN

## 2021-07-19 MED ORDER — DOCUSATE SODIUM 100 MG PO CAPS
100.0000 mg | ORAL_CAPSULE | Freq: Two times a day (BID) | ORAL | Status: DC
  Administered 2021-07-19 – 2021-07-20 (×3): 100 mg via ORAL
  Filled 2021-07-19 (×3): qty 1

## 2021-07-19 MED ORDER — GABAPENTIN 300 MG PO CAPS
300.0000 mg | ORAL_CAPSULE | Freq: Three times a day (TID) | ORAL | Status: DC
  Administered 2021-07-19 – 2021-07-20 (×3): 300 mg via ORAL
  Filled 2021-07-19 (×3): qty 1

## 2021-07-19 MED ORDER — LACTATED RINGERS IV SOLN: INTRAVENOUS | Status: DC

## 2021-07-19 MED ORDER — DEXAMETHASONE SODIUM PHOSPHATE 10 MG/ML IJ SOLN: 8.0000 mg | Freq: Once | INTRAMUSCULAR | Status: AC

## 2021-07-19 MED ORDER — DIPHENHYDRAMINE HCL 12.5 MG/5ML PO ELIX: 12.5000 mg | ORAL_SOLUTION | ORAL | Status: DC | PRN

## 2021-07-19 MED ORDER — CEFAZOLIN SODIUM-DEXTROSE 2-4 GM/100ML-% IV SOLN
2.0000 g | Freq: Four times a day (QID) | INTRAVENOUS | Status: AC
  Administered 2021-07-19 (×2): 2 g via INTRAVENOUS
  Filled 2021-07-19 (×2): qty 100

## 2021-07-19 MED ORDER — SODIUM CHLORIDE 0.9 % IR SOLN
Status: DC | PRN
  Administered 2021-07-19: 1000 mL

## 2021-07-19 MED ORDER — GLYCOPYRROLATE PF 0.2 MG/ML IJ SOSY
PREFILLED_SYRINGE | INTRAMUSCULAR | Status: DC | PRN
  Administered 2021-07-19: .1 mg via INTRAVENOUS

## 2021-07-19 SURGICAL SUPPLY — 52 items
ATTUNE MED DOME PAT 41 KNEE (Knees) ×2 IMPLANT
ATTUNE PS FEM LT SZ 6 CEM KNEE (Femur) ×2 IMPLANT
ATTUNE PSRP INSR SZ6 10 KNEE (Insert) ×2 IMPLANT
BAG COUNTER SPONGE SURGICOUNT (BAG) ×2 IMPLANT
BAG ZIPLOCK 12X15 (MISCELLANEOUS) IMPLANT
BASE TIBIA ATTUNE KNEE SYS SZ6 (Knees) ×1 IMPLANT
BLADE SAG 18X100X1.27 (BLADE) ×2 IMPLANT
BLADE SAW SGTL 11.0X1.19X90.0M (BLADE) ×2 IMPLANT
BNDG ELASTIC 6X5.8 VLCR STR LF (GAUZE/BANDAGES/DRESSINGS) ×2 IMPLANT
BOWL SMART MIX CTS (DISPOSABLE) ×2 IMPLANT
CEMENT HV SMART SET (Cement) ×4 IMPLANT
COVER SURGICAL LIGHT HANDLE (MISCELLANEOUS) ×2 IMPLANT
CUFF TOURN SGL QUICK 34 (TOURNIQUET CUFF) ×1
CUFF TRNQT CYL 34X4.125X (TOURNIQUET CUFF) ×1 IMPLANT
DECANTER SPIKE VIAL GLASS SM (MISCELLANEOUS) ×2 IMPLANT
DRAPE U-SHAPE 47X51 STRL (DRAPES) ×2 IMPLANT
DRSG AQUACEL AG ADV 3.5X10 (GAUZE/BANDAGES/DRESSINGS) ×2 IMPLANT
DURAPREP 26ML APPLICATOR (WOUND CARE) ×2 IMPLANT
ELECT REM PT RETURN 15FT ADLT (MISCELLANEOUS) ×2 IMPLANT
GLOVE SRG 8 PF TXTR STRL LF DI (GLOVE) ×1 IMPLANT
GLOVE SURG ENC MOIS LTX SZ6.5 (GLOVE) ×2 IMPLANT
GLOVE SURG ENC MOIS LTX SZ8 (GLOVE) ×4 IMPLANT
GLOVE SURG UNDER POLY LF SZ7 (GLOVE) ×2 IMPLANT
GLOVE SURG UNDER POLY LF SZ8 (GLOVE) ×1
GLOVE SURG UNDER POLY LF SZ8.5 (GLOVE) ×2 IMPLANT
GOWN STRL REUS W/TWL LRG LVL3 (GOWN DISPOSABLE) ×4 IMPLANT
GOWN STRL REUS W/TWL XL LVL3 (GOWN DISPOSABLE) ×2 IMPLANT
HANDPIECE INTERPULSE COAX TIP (DISPOSABLE) ×1
HOLDER FOLEY CATH W/STRAP (MISCELLANEOUS) ×2 IMPLANT
IMMOBILIZER KNEE 20 (SOFTGOODS) ×2
IMMOBILIZER KNEE 20 THIGH 36 (SOFTGOODS) ×1 IMPLANT
KIT TURNOVER KIT A (KITS) ×2 IMPLANT
MANIFOLD NEPTUNE II (INSTRUMENTS) ×2 IMPLANT
NS IRRIG 1000ML POUR BTL (IV SOLUTION) ×2 IMPLANT
PACK TOTAL KNEE CUSTOM (KITS) ×2 IMPLANT
PADDING CAST COTTON 6X4 STRL (CAST SUPPLIES) ×2 IMPLANT
PENCIL SMOKE EVACUATOR (MISCELLANEOUS) ×2 IMPLANT
PIN DRILL FIX HALF THREAD (BIT) ×2 IMPLANT
PIN STEINMAN FIXATION KNEE (PIN) ×2 IMPLANT
PROTECTOR NERVE ULNAR (MISCELLANEOUS) ×2 IMPLANT
SET HNDPC FAN SPRY TIP SCT (DISPOSABLE) ×1 IMPLANT
STRIP CLOSURE SKIN 1/2X4 (GAUZE/BANDAGES/DRESSINGS) ×4 IMPLANT
SUT MNCRL AB 4-0 PS2 18 (SUTURE) ×2 IMPLANT
SUT STRATAFIX 0 PDS 27 VIOLET (SUTURE) ×2
SUT VIC AB 2-0 CT1 27 (SUTURE) ×3
SUT VIC AB 2-0 CT1 TAPERPNT 27 (SUTURE) ×3 IMPLANT
SUTURE STRATFX 0 PDS 27 VIOLET (SUTURE) ×1 IMPLANT
TIBIA ATTUNE KNEE SYS BASE SZ6 (Knees) ×2 IMPLANT
TRAY FOLEY MTR SLVR 16FR STAT (SET/KITS/TRAYS/PACK) ×2 IMPLANT
TUBE SUCTION HIGH CAP CLEAR NV (SUCTIONS) ×2 IMPLANT
WATER STERILE IRR 1000ML POUR (IV SOLUTION) ×4 IMPLANT
WRAP KNEE MAXI GEL POST OP (GAUZE/BANDAGES/DRESSINGS) ×2 IMPLANT

## 2021-07-19 NOTE — Anesthesia Postprocedure Evaluation (Signed)
Anesthesia Post Note  Patient: Benjamin Rivas  Procedure(s) Performed: TOTAL KNEE ARTHROPLASTY (Left: Knee)     Patient location during evaluation: PACU Anesthesia Type: Spinal Level of consciousness: awake and alert and oriented Pain management: pain level controlled Vital Signs Assessment: post-procedure vital signs reviewed and stable Respiratory status: spontaneous breathing, nonlabored ventilation and respiratory function stable Cardiovascular status: blood pressure returned to baseline Postop Assessment: no apparent nausea or vomiting, spinal receding, no headache and no backache Anesthetic complications: no   No notable events documented.  Last Vitals:  Vitals:   07/19/21 1115 07/19/21 1130  BP: (!) 103/57 114/69  Pulse: 75 70  Resp: 12 16  Temp: (!) 36.4 C   SpO2: 100% 100%    Last Pain:  Vitals:   07/19/21 1130  TempSrc:   PainSc: [place]

## 2021-07-19 NOTE — Care Plan (Signed)
Ortho Bundle Case Management Note  Patient Details  Name: Benjamin Rivas MRN: [id number] Date of Birth: [birth year]  L TKA on 07-19-21 DCP:  Home with wife.  1 story home with 3 ste. DME:  No needs.  Has a RW and 3-in-1 PT:  ProPT.  Eval scheduled on 07-22-21.                   DME Arranged:  N/A DME Agency:  NA  HH Arranged:  NA HH Agency:  NA  Additional Comments: Please contact me with any questions of if this plan should need to change.  Marianne Sofia, RN,CCM [facility]  [phone] 07/19/2021, 4:20 PM

## 2021-07-19 NOTE — Op Note (Signed)
OPERATIVE REPORT-TOTAL KNEE ARTHROPLASTY   Pre-operative diagnosis- Osteoarthritis  Left knee(s)  Post-operative diagnosis- Osteoarthritis Left knee(s)  Procedure-  Left  Total Knee Arthroplasty  Surgeon- Dione Plover. Melvie Paglia, MD  Assistant- Molli Barrows, PA-C   Anesthesia-   Adductor canal block and spinal  EBL-25 ml   Drains None  Tourniquet time-  Total Tourniquet Time Documented: Thigh (Left) - 38 minutes Total: Thigh (Left) - 38 minutes     Complications- None  Condition-PACU - hemodynamically stable.   Brief Clinical Note   Benjamin Rivas TEWS is a 75 y.o. year old male with end stage OA of his left knee with progressively worsening pain and dysfunction. He has constant pain, with activity and at rest and significant functional deficits with difficulties even with ADLs. He has had extensive non-op management including analgesics, injections of cortisone and viscosupplements, and home exercise program, but remains in significant pain with significant dysfunction. Radiographs show bone on bone arthritis medial and patellofemoral. He presents now for left Total Knee Arthroplasty.     Procedure in detail---   The patient is brought into the operating room and positioned supine on the operating table. After successful administration of  Adductor canal block and spinal,   a tourniquet is placed high on the  Left thigh(s) and the lower extremity is prepped and draped in the usual sterile fashion. Time out is performed by the operating team and then the  Left lower extremity is wrapped in Esmarch, knee flexed and the tourniquet inflated to 300 mmHg.       A midline incision is made with a ten blade through the subcutaneous tissue to the level of the extensor mechanism. A fresh blade is used to make a medial parapatellar arthrotomy. Soft tissue over the proximal medial tibia is subperiosteally elevated to the joint line with a knife and into the semimembranosus bursa with a Cobb elevator.  Soft tissue over the proximal lateral tibia is elevated with attention being paid to avoiding the patellar tendon on the tibial tubercle. The patella is everted, knee flexed 90 degrees and the ACL and PCL are removed. Findings are bone on bone medial and patellofemoral with large global osteophytes.        The drill is used to create a starting hole in the distal femur and the canal is thoroughly irrigated with sterile saline to remove the fatty contents. The 5 degree Left  valgus alignment guide is placed into the femoral canal and the distal femoral cutting block is pinned to remove 9 mm off the distal femur. Resection is made with an oscillating saw.      The tibia is subluxed forward and the menisci are removed. The extramedullary alignment guide is placed referencing proximally at the medial aspect of the tibial tubercle and distally along the second metatarsal axis and tibial crest. The block is pinned to remove 62m off the more deficient medial  side. Resection is made with an oscillating saw. Size 6is the most appropriate size for the tibia and the proximal tibia is prepared with the modular drill and keel punch for that size.      The femoral sizing guide is placed and size 6 is most appropriate. Rotation is marked off the epicondylar axis and confirmed by creating a rectangular flexion gap at 90 degrees. The size 6 cutting block is pinned in this rotation and the anterior, posterior and chamfer cuts are made with the oscillating saw. The intercondylar block is then placed and that cut  is made.      Trial size 6 tibial component, trial size 6 posterior stabilized femur and a 10  mm posterior stabilized rotating platform insert trial is placed. Full extension is achieved with excellent varus/valgus and anterior/posterior balance throughout full range of motion. The patella is everted and thickness measured to be 27  mm. Free hand resection is taken to 15 mm, a 41 template is placed, lug holes are  drilled, trial patella is placed, and it tracks normally. Osteophytes are removed off the posterior femur with the trial in place. All trials are removed and the cut bone surfaces prepared with pulsatile lavage. Cement is mixed and once ready for implantation, the size 6 tibial implant, size  6 posterior stabilized femoral component, and the size 41 patella are cemented in place and the patella is held with the clamp. The trial insert is placed and the knee held in full extension. The Exparel (20 ml mixed with 60 ml saline) is injected into the extensor mechanism, posterior capsule, medial and lateral gutters and subcutaneous tissues.  All extruded cement is removed and once the cement is hard the permanent 10 mm posterior stabilized rotating platform insert is placed into the tibial tray.      The wound is copiously irrigated with saline solution and the extensor mechanism closed with # 0 Stratofix suture. The tourniquet is released for a total tourniquet time of 37  minutes. Flexion against gravity is 140 degrees and the patella tracks normally. Subcutaneous tissue is closed with 2.0 vicryl and subcuticular with running 4.0 Monocryl. The incision is cleaned and dried and steri-strips and a bulky sterile dressing are applied. The limb is placed into a knee immobilizer and the patient is awakened and transported to recovery in stable condition.      Please note that a surgical assistant was a medical necessity for this procedure in order to perform it in a safe and expeditious manner. Surgical assistant was necessary to retract the ligaments and vital neurovascular structures to prevent injury to them and also necessary for proper positioning of the limb to allow for anatomic placement of the prosthesis.   Dione Plover Raquell Richer, MD    07/19/2021, 10:46 AM

## 2021-07-19 NOTE — Progress Notes (Signed)
Assisted Dr. Daiva Huge with left, ultrasound guided, adductor canal block. Side rails up, monitors on throughout procedure. See vital signs in flow sheet. Tolerated Procedure well.

## 2021-07-19 NOTE — Transfer of Care (Signed)
Immediate Anesthesia Transfer of Care Note  Patient: Benjamin Rivas  Procedure(s) Performed: Procedure(s) with comments: TOTAL KNEE ARTHROPLASTY (Left) - 26mn  Patient Location: PACU  Anesthesia Type:Spinal  Level of Consciousness: awake, alert  and oriented  Airway & Oxygen Therapy: Patient Spontanous Breathing  Post-op Assessment: Report given to RN and Post -op Vital signs reviewed and stable  Post vital signs: Reviewed and stable  Last Vitals:  Vitals:   07/19/21 0923 07/19/21 0928  BP: (!) 154/74 (!) 143/65  Pulse: 87 91  Resp: 20 15  Temp:    SpO2: [id number]   Complications: No apparent anesthesia complications

## 2021-07-19 NOTE — Anesthesia Procedure Notes (Signed)
Spinal  Patient location during procedure: OR Start time: 07/19/2021 9:42 AM Reason for block: surgical anesthesia Staffing Performed: resident/CRNA  Anesthesiologist: Brennan Bailey, MD Resident/CRNA: Gerald Leitz, CRNA Preanesthetic Checklist Completed: patient identified, IV checked, site marked, risks and benefits discussed, surgical consent, monitors and equipment checked, pre-op evaluation and timeout performed Spinal Block Patient position: sitting Prep: DuraPrep Patient monitoring: heart rate, continuous pulse ox, blood pressure and cardiac monitor Approach: midline Location: L3-4 Injection technique: single-shot Needle Needle type: Introducer and Pencan  Needle gauge: 24 G Needle length: 9 cm Assessment Sensory level: T4 Events: CSF return Additional Notes Negative paresthesia. Negative blood return. Positive free-flowing CSF. Expiration date of kit checked and confirmed. Patient tolerated procedure well, without complications.

## 2021-07-19 NOTE — Evaluation (Signed)
Physical Therapy Evaluation Patient Details Name: Benjamin Rivas MRN: [id number] DOB: 09-02-1946 Today's Date: 07/19/2021   History of Present Illness  Pt s/p L TKR and with hx of L THR, and prostate CA  Clinical Impression  Pt s/p L TKR and presents with decreased L LE strength/ROM and post op pain limiting functional mobility.  Pt should progress to dc home with family assist and reports first OPPT scheduled for Thursday 07/22/21    Follow Up Recommendations Follow surgeon's recommendation for DC plan and follow-up therapies    Equipment Recommendations  None recommended by PT    Recommendations for Other Services       Precautions / Restrictions Precautions Precautions: Fall;Knee Required Braces or Orthoses: Knee Immobilizer - Left Knee Immobilizer - Left: Discontinue once straight leg raise with < 10 degree lag Restrictions Weight Bearing Restrictions: No LLE Weight Bearing: Weight bearing as tolerated      Mobility  Bed Mobility Overal bed mobility: Needs Assistance Bed Mobility: Supine to Sit     Supine to sit: Min assist     General bed mobility comments: min assist for L LE, use of rail, cues for sequence    Transfers Overall transfer level: Needs assistance Equipment used: Rolling walker (2 wheeled) Transfers: Sit to/from Stand Sit to Stand: Min assist         General transfer comment: cues for LE management and use of UEs to self assist  Ambulation/Gait Ambulation/Gait assistance: Min assist;Min guard Gait Distance (Feet): 95 Feet Assistive device: Rolling walker (2 wheeled) Gait Pattern/deviations: Step-to pattern;Decreased step length - right;Decreased step length - left;Shuffle;Trunk flexed Gait velocity: decr   General Gait Details: cues for posture, position from RW and initial sequence  Stairs            Wheelchair Mobility    Modified Rankin (Stroke Patients Only)       Balance Overall balance assessment: Needs  assistance Sitting-balance support: No upper extremity supported;Feet supported Sitting balance-Leahy Scale: Good     Standing balance support: Single extremity supported Standing balance-Leahy Scale: Poor                               Pertinent Vitals/Pain Pain Assessment: 0-10 Pain Score: 5  Pain Location: L knee Pain Descriptors / Indicators: Aching;Sore Pain Intervention(s): Limited activity within patient's tolerance;Monitored during session;Premedicated before session;Ice applied    Home Living Family/patient expects to be discharged to:: Private residence Living Arrangements: Spouse/significant other Available Help at Discharge: Family Type of Home: House Home Access: Stairs to enter Entrance Stairs-Rails: Chemical engineer of Steps: 3 Home Layout: Able to live on main level with bedroom/bathroom Home Equipment: Walker - 2 wheels;Cane - single point;Bedside commode      Prior Function Level of Independence: Independent               Hand Dominance   Dominant Hand: Right    Extremity/Trunk Assessment   Upper Extremity Assessment Upper Extremity Assessment: Overall WFL for tasks assessed    Lower Extremity Assessment Lower Extremity Assessment: LLE deficits/detail    Cervical / Trunk Assessment Cervical / Trunk Assessment: Normal  Communication   Communication: No difficulties  Cognition Arousal/Alertness: Awake/alert Behavior During Therapy: WFL for tasks assessed/performed Overall Cognitive Status: Within Functional Limits for tasks assessed  General Comments      Exercises Total Joint Exercises Ankle Circles/Pumps: AROM;Both;15 reps;Supine   Assessment/Plan    PT Assessment Patient needs continued PT services  PT Problem List Decreased strength;Decreased range of motion;Decreased activity tolerance;Decreased balance;Decreased mobility;Decreased knowledge of  use of DME;Pain       PT Treatment Interventions DME instruction;Gait training;Stair training;Functional mobility training;Therapeutic activities;Therapeutic exercise;Patient/family education    PT Goals (Current goals can be found in the Care Plan section)  Acute Rehab PT Goals Patient Stated Goal: Regain IND PT Goal Formulation: With patient Time For Goal Achievement: 07/26/21 Potential to Achieve Goals: Good    Frequency 7X/week   Barriers to discharge        Co-evaluation               AM-PAC PT "6 Clicks" Mobility  Outcome Measure Help needed turning from your back to your side while in a flat bed without using bedrails?: A Little Help needed moving from lying on your back to sitting on the side of a flat bed without using bedrails?: A Little Help needed moving to and from a bed to a chair (including a wheelchair)?: A Little Help needed standing up from a chair using your arms (e.g., wheelchair or bedside chair)?: A Little Help needed to walk in hospital room?: A Little Help needed climbing 3-5 steps with a railing? : A Lot 6 Click Score: 17    End of Session Equipment Utilized During Treatment: Gait belt;Left knee immobilizer Activity Tolerance: Patient tolerated treatment well Patient left: in chair;with call bell/phone within reach;with chair alarm set Nurse Communication: Mobility status PT Visit Diagnosis: Difficulty in walking, not elsewhere classified (R26.2)    Time: [id number] PT Time Calculation (min) (ACUTE ONLY): 27 min   Charges:   PT Evaluation $PT Eval Low Complexity: 1 Low PT Treatments $Gait Training: 8-22 mins        [place] Pager [phone] Office [phone]   Onia Shiflett 07/19/2021, 5:18 PM

## 2021-07-19 NOTE — Anesthesia Procedure Notes (Signed)
Anesthesia Regional Block: Adductor canal block   Pre-Anesthetic Checklist: , timeout performed,  Correct Patient, Correct Site, Correct Laterality,  Correct Procedure, Correct Position, site marked,  Risks and benefits discussed,  Pre-op evaluation,  At surgeon's request and post-op pain management  Laterality: Left  Prep: Maximum Sterile Barrier Precautions used, chloraprep       Needles:  Injection technique: Single-shot  Needle Type: Echogenic Stimulator Needle     Needle Length: 9cm  Needle Gauge: 22     Additional Needles:   Procedures:,,,, ultrasound used (permanent image in chart),,    Narrative:  Start time: 07/19/2021 9:10 AM End time: 07/19/2021 9:13 AM Injection made incrementally with aspirations every 5 mL.  Performed by: Personally  Anesthesiologist: Brennan Bailey, MD  Additional Notes: Risks, benefits, and alternative discussed. Patient gave consent for procedure. Patient prepped and draped in sterile fashion. Sedation administered, patient remains easily responsive to voice. Relevant anatomy identified with ultrasound guidance. Local anesthetic given in 5cc increments with no signs or symptoms of intravascular injection. No pain or paraesthesias with injection. Patient monitored throughout procedure with signs of LAST or immediate complications. Tolerated well. Ultrasound image placed in chart.  Tawny Asal, MD

## 2021-07-19 NOTE — Discharge Instructions (Addendum)
 Benjamin Aluisio, MD Total Joint Specialist [facility] [address] [name], [name] [address] [phone]  TOTAL KNEE REPLACEMENT POSTOPERATIVE DIRECTIONS    Knee Rehabilitation, Guidelines Following Surgery  Results after knee surgery are often greatly improved when you follow the exercise, range of motion and muscle strengthening exercises prescribed by your doctor. Safety measures are also important to protect the knee from further injury. If any of these exercises cause you to have increased pain or swelling in your knee joint, decrease the amount until you are comfortable again and slowly increase them. If you have problems or questions, call your caregiver or physical therapist for advice.   BLOOD CLOT PREVENTION Take a 10 mg Xarelto once a day for three weeks following surgery. Then take an 81 mg Aspirin once a day for three weeks. Then discontinue Aspirin. You may resume your vitamins/supplements once you have discontinued the Xarelto. Do not take any NSAIDs (Advil, Aleve, Ibuprofen, Meloxicam, etc.) until you have discontinued the Xarelto.   HOME CARE INSTRUCTIONS  Remove items at home which could result in a fall. This includes throw rugs or furniture in walking pathways.  ICE to the affected knee as much as tolerated. Icing helps control swelling. If the swelling is well controlled you will be more comfortable and rehab easier. Continue to use ice on the knee for pain and swelling from surgery. You may notice swelling that will progress down to the foot and ankle. This is normal after surgery. Elevate the leg when you are not up walking on it.    Continue to use the breathing machine which will help keep your temperature down. It is common for your temperature to cycle up and down following surgery, especially at night when you are not up moving around and exerting yourself. The breathing machine keeps your lungs expanded and your temperature  down. Do not place pillow under the operative knee, focus on keeping the knee straight while resting  DIET You may resume your previous home diet once you are discharged from the hospital.  DRESSING / WOUND CARE / SHOWERING Keep your bulky bandage on for 2 days. On the third post-operative day you may remove the Ace bandage and gauze. There is a waterproof adhesive bandage on your skin which will stay in place until your first follow-up appointment. Once you remove this you will not need to place another bandage You may begin showering 3 days following surgery, but do not submerge the incision under water.  ACTIVITY For the first 5 days, the key is rest and control of pain and swelling Do your home exercises twice a day starting on post-operative day 3. On the days you go to physical therapy, just do the home exercises once that day. You should rest, ice and elevate the leg for 50 minutes out of every hour. Get up and walk/stretch for 10 minutes per hour. After 5 days you can increase your activity slowly as tolerated. Walk with your walker as instructed. Use the walker until you are comfortable transitioning to a cane. Walk with the cane in the opposite hand of the operative leg. You may discontinue the cane once you are comfortable and walking steadily. Avoid periods of inactivity such as sitting longer than an hour when not asleep. This helps prevent blood clots.  You may discontinue the knee immobilizer once you are able to perform a straight leg raise while lying down. You may resume a sexual relationship in one month or   when given the OK by your doctor.  You may return to work once you are cleared by your doctor.  Do not drive a car for 6 weeks or until released by your surgeon.  Do not drive while taking narcotics.  TED HOSE STOCKINGS Wear the elastic stockings on both legs for three weeks following surgery during the day. You may remove them at night for sleeping.  WEIGHT  BEARING Weight bearing as tolerated with assist device (walker, cane, etc) as directed, use it as long as suggested by your surgeon or therapist, typically at least 4-6 weeks.  POSTOPERATIVE CONSTIPATION PROTOCOL Constipation - defined medically as fewer than three stools per week and severe constipation as less than one stool per week.  One of the most common issues patients have following surgery is constipation.  Even if you have a regular bowel pattern at home, your normal regimen is likely to be disrupted due to multiple reasons following surgery.  Combination of anesthesia, postoperative narcotics, change in appetite and fluid intake all can affect your bowels.  In order to avoid complications following surgery, here are some recommendations in order to help you during your recovery period.  Colace (docusate) - Pick up an over-the-counter form of Colace or another stool softener and take twice a day as long as you are requiring postoperative pain medications.  Take with a full glass of water daily.  If you experience loose stools or diarrhea, hold the colace until you stool forms back up. If your symptoms do not get better within 1 week or if they get worse, check with your doctor. Dulcolax (bisacodyl) - Pick up over-the-counter and take as directed by the product packaging as needed to assist with the movement of your bowels.  Take with a full glass of water.  Use this product as needed if not relieved by Colace only.  MiraLax (polyethylene glycol) - Pick up over-the-counter to have on hand. MiraLax is a solution that will increase the amount of water in your bowels to assist with bowel movements.  Take as directed and can mix with a glass of water, juice, soda, coffee, or tea. Take if you go more than two days without a movement. Do not use MiraLax more than once per day. Call your doctor if you are still constipated or irregular after using this medication for 7 days in a row.  If you continue  to have problems with postoperative constipation, please contact the office for further assistance and recommendations.  If you experience "the worst abdominal pain ever" or develop nausea or vomiting, please contact the office immediatly for further recommendations for treatment.  ITCHING If you experience itching with your medications, try taking only a single pain pill, or even half a pain pill at a time.  You can also use Benadryl over the counter for itching or also to help with sleep.   MEDICATIONS See your medication summary on the "After Visit Summary" that the nursing staff will review with you prior to discharge.  You may have some home medications which will be placed on hold until you complete the course of blood thinner medication.  It is important for you to complete the blood thinner medication as prescribed by your surgeon.  Continue your approved medications as instructed at time of discharge.  PRECAUTIONS If you experience chest pain or shortness of breath - call 911 immediately for transfer to the hospital emergency department.  If you develop a fever greater that 101 F, purulent   drainage from wound, increased redness or drainage from wound, foul odor from the wound/dressing, or calf pain - CONTACT YOUR SURGEON.                                                   FOLLOW-UP APPOINTMENTS Make sure you keep all of your appointments after your operation with your surgeon and caregivers. You should call the office at the above phone number and make an appointment for approximately two weeks after the date of your surgery or on the date instructed by your surgeon outlined in the "After Visit Summary".  RANGE OF MOTION AND STRENGTHENING EXERCISES  Rehabilitation of the knee is important following a knee injury or an operation. After just a few days of immobilization, the muscles of the thigh which control the knee become weakened and shrink (atrophy). Knee exercises are designed to build up  the tone and strength of the thigh muscles and to improve knee motion. Often times heat used for twenty to thirty minutes before working out will loosen up your tissues and help with improving the range of motion but do not use heat for the first two weeks following surgery. These exercises can be done on a training (exercise) mat, on the floor, on a table or on a bed. Use what ever works the best and is most comfortable for you Knee exercises include:  Leg Lifts - While your knee is still immobilized in a splint or cast, you can do straight leg raises. Lift the leg to 60 degrees, hold for 3 sec, and slowly lower the leg. Repeat 10-20 times 2-3 times daily. Perform this exercise against resistance later as your knee gets better.  Quad and Hamstring Sets - Tighten up the muscle on the front of the thigh (Quad) and hold for 5-10 sec. Repeat this 10-20 times hourly. Hamstring sets are done by pushing the foot backward against an object and holding for 5-10 sec. Repeat as with quad sets.  Leg Slides: Lying on your back, slowly slide your foot toward your buttocks, bending your knee up off the floor (only go as far as is comfortable). Then slowly slide your foot back down until your leg is flat on the floor again. Angel Wings: Lying on your back spread your legs to the side as far apart as you can without causing discomfort.  A rehabilitation program following serious knee injuries can speed recovery and prevent re-injury in the future due to weakened muscles. Contact your doctor or a physical therapist for more information on knee rehabilitation.   POST-OPERATIVE OPIOID TAPER INSTRUCTIONS: It is important to wean off of your opioid medication as soon as possible. If you do not need pain medication after your surgery it is ok to stop day one. Opioids include: Codeine, Hydrocodone(Norco, Vicodin), Oxycodone(Percocet, oxycontin) and hydromorphone amongst others.  Long term and even short term use of opiods can  cause: Increased pain response Dependence Constipation Depression Respiratory depression And more.  Withdrawal symptoms can include Flu like symptoms Nausea, vomiting And more Techniques to manage these symptoms Hydrate well Eat regular healthy meals Stay active Use relaxation techniques(deep breathing, meditating, yoga) Do Not substitute Alcohol to help with tapering If you have been on opioids for less than two weeks and do not have pain than it is ok to stop all together.  Plan to   wean off of opioids This plan should start within one week post op of your joint replacement. Maintain the same interval or time between taking each dose and first decrease the dose.  Cut the total daily intake of opioids by one tablet each day Next start to increase the time between doses. The last dose that should be eliminated is the evening dose.   IF YOU ARE TRANSFERRED TO A SKILLED REHAB FACILITY If the patient is transferred to a skilled rehab facility following release from the hospital, a list of the current medications will be sent to the facility for the patient to continue.  When discharged from the skilled rehab facility, please have the facility set up the patient's Home Health Physical Therapy prior to being released. Also, the skilled facility will be responsible for providing the patient with their medications at time of release from the facility to include their pain medication, the muscle relaxants, and their blood thinner medication. If the patient is still at the rehab facility at time of the two week follow up appointment, the skilled rehab facility will also need to assist the patient in arranging follow up appointment in our office and any transportation needs.  MAKE SURE YOU:  Understand these instructions.  Get help right away if you are not doing well or get worse.   DENTAL ANTIBIOTICS:  In most cases prophylactic antibiotics for Dental procdeures after total joint surgery are  not necessary.  Exceptions are as follows:  1. History of prior total joint infection  2. Severely immunocompromised (Organ Transplant, cancer chemotherapy, Rheumatoid biologic meds such as Humera)  3. Poorly controlled diabetes (A1C &gt; 8.0, blood glucose over 200)  If you have one of these conditions, contact your surgeon for an antibiotic prescription, prior to your dental procedure.    Pick up stool softner and laxative for home use following surgery while on pain medications. Do not submerge incision under water. Please use good hand washing techniques while changing dressing each day. May shower starting three days after surgery. Please use a clean towel to pat the incision dry following showers. Continue to use ice for pain and swelling after surgery. Do not use any lotions or creams on the incision until instructed by your surgeon.  Information on my medicine - XARELTO (Rivaroxaban)  Why was Xarelto prescribed for you? Xarelto was prescribed for you to reduce the risk of blood clots forming after orthopedic surgery. The medical term for these abnormal blood clots is venous thromboembolism (VTE).  What do you need to know about xarelto ? Take your Xarelto ONCE DAILY at the same time every day. You may take it either with or without food.  If you have difficulty swallowing the tablet whole, you may crush it and mix in applesauce just prior to taking your dose.  Take Xarelto exactly as prescribed by your doctor and DO NOT stop taking Xarelto without talking to the doctor who prescribed the medication.  Stopping without other VTE prevention medication to take the place of Xarelto may increase your risk of developing a clot.  After discharge, you should have regular check-up appointments with your healthcare provider that is prescribing your Xarelto.    What do you do if you miss a dose? If you miss a dose, take it as soon as you remember on the same day then  continue your regularly scheduled once daily regimen the next day. Do not take two doses of Xarelto on the same day.   Important   Safety Information A possible side effect of Xarelto is bleeding. You should call your healthcare provider right away if you experience any of the following: Bleeding from an injury or your nose that does not stop. Unusual colored urine (red or dark brown) or unusual colored stools (red or black). Unusual bruising for unknown reasons. A serious fall or if you hit your head (even if there is no bleeding).  Some medicines may interact with Xarelto and might increase your risk of bleeding while on Xarelto. To help avoid this, consult your healthcare provider or pharmacist prior to using any new prescription or non-prescription medications, including herbals, vitamins, non-steroidal anti-inflammatory drugs (NSAIDs) and supplements.  This website has more information on Xarelto: www.xarelto.com.    

## 2021-07-19 NOTE — Interval H&P Note (Signed)
History and Physical Interval Note:  07/19/2021 6:57 AM  Benjamin Rivas  has presented today for surgery, with the diagnosis of left knee osteoarthritis.  The various methods of treatment have been discussed with the patient and family. After consideration of risks, benefits and other options for treatment, the patient has consented to  Procedure(s) with comments: TOTAL KNEE ARTHROPLASTY (Left) - 83mn as a surgical intervention.  The patient's history has been reviewed, patient examined, no change in status, stable for surgery.  I have reviewed the patient's chart and labs.  Questions were answered to the patient's satisfaction.     FPilar PlateAluisio

## 2021-07-20 ENCOUNTER — Encounter (HOSPITAL_COMMUNITY): Payer: Self-pay | Admitting: Orthopedic Surgery

## 2021-07-20 DIAGNOSIS — Z8546 Personal history of malignant neoplasm of prostate: Secondary | ICD-10-CM | POA: Diagnosis not present

## 2021-07-20 DIAGNOSIS — Z79899 Other long term (current) drug therapy: Secondary | ICD-10-CM | POA: Diagnosis not present

## 2021-07-20 DIAGNOSIS — M1712 Unilateral primary osteoarthritis, left knee: Secondary | ICD-10-CM | POA: Diagnosis not present

## 2021-07-20 DIAGNOSIS — Z96642 Presence of left artificial hip joint: Secondary | ICD-10-CM | POA: Diagnosis not present

## 2021-07-20 DIAGNOSIS — Z7982 Long term (current) use of aspirin: Secondary | ICD-10-CM | POA: Diagnosis not present

## 2021-07-20 LAB — BASIC METABOLIC PANEL
Anion gap: 7 (ref 5–15)
BUN: 15 mg/dL (ref 8–23)
CO2: 25 mmol/L (ref 22–32)
Calcium: 8.6 mg/dL — ABNORMAL LOW (ref 8.9–10.3)
Chloride: 102 mmol/L (ref 98–111)
Creatinine, Ser: 1.07 mg/dL (ref 0.61–1.24)
GFR, Estimated: >60 mL/min (ref >60)
Glucose, Bld: 142 mg/dL — ABNORMAL HIGH (ref 70–99)
Potassium: 4.2 mmol/L (ref 3.5–5.1)
Sodium: 134 mmol/L — ABNORMAL LOW (ref 135–145)

## 2021-07-20 LAB — CBC
HCT: 41.5 % (ref 39.0–52.0)
Hemoglobin: 13.6 g/dL (ref 13.0–17.0)
MCH: 30.8 pg (ref 26.0–34.0)
MCHC: 32.8 g/dL (ref 30.0–36.0)
MCV: 93.9 fL (ref 80.0–100.0)
Platelets: 206 10*3/uL (ref 150–400)
RBC: 4.42 MIL/uL (ref 4.22–5.81)
RDW: 13 % (ref 11.5–15.5)
WBC: 15 10*3/uL — ABNORMAL HIGH (ref 4.0–10.5)
nRBC: 0 % (ref 0.0–0.2)

## 2021-07-20 MED ORDER — TRAMADOL HCL 50 MG PO TABS: 50.0000 mg | ORAL_TABLET | Freq: Four times a day (QID) | ORAL | 0 refills | Status: AC | PRN

## 2021-07-20 MED ORDER — GABAPENTIN 300 MG PO CAPS: ORAL_CAPSULE | ORAL | 0 refills | Status: AC

## 2021-07-20 MED ORDER — OXYCODONE HCL 5 MG PO TABS: 5.0000 mg | ORAL_TABLET | Freq: Four times a day (QID) | ORAL | 0 refills | Status: AC | PRN

## 2021-07-20 MED ORDER — METHOCARBAMOL 500 MG PO TABS: 500.0000 mg | ORAL_TABLET | Freq: Four times a day (QID) | ORAL | 0 refills | Status: AC | PRN

## 2021-07-20 MED ORDER — RIVAROXABAN 10 MG PO TABS: 10.0000 mg | ORAL_TABLET | Freq: Every day | ORAL | 0 refills | Status: AC

## 2021-07-20 NOTE — TOC Transition Note (Signed)
Transition of Care [facility]) - CM/SW Discharge Note  Patient Details  Name: Benjamin Rivas MRN: [id number] Date of Birth: July 28, 1946  Transition of [facility]) CM/SW Contact:  Sherie Don, LCSW Phone Number: 07/20/2021, 11:43 AM  Clinical Narrative: Patient is expected to discharge home after working with PT. CSW met with patient to review discharge plan. Patient will go to OPPT at [facility] in [place] with his first appointment scheduled for 07/22/21. Patient reported he has a rolling walker, 3N1, and cane at home so there are no DME needs at this time. TOC signing off.  Final next level of care: OP Rehab Barriers to Discharge: No Barriers Identified  Patient Goals and CMS Choice Patient states their goals for this hospitalization and ongoing recovery are:: Discharge home with [place] CMS Medicare.gov Compare Post Acute Care list provided to:: Patient Choice offered to / list presented to : Patient  Discharge Plan and Services         DME Arranged: N/A DME Agency: NA HH Arranged: NA [place] Agency: NA  Readmission Risk Interventions No flowsheet data found.

## 2021-07-20 NOTE — Progress Notes (Signed)
Subjective: 1 Day Post-Op Procedure(s) (LRB): TOTAL KNEE ARTHROPLASTY (Left) Patient reports pain as mild.   Patient seen in rounds by Dr. Wynelle Link. Patient is well, and has had no acute complaints or problems other than pain in the left knee. No issues overnight. Denies chest pain or SOB. Foley catheter to be removed this AM. We will continue therapy today, ambulated 95' yesterday.   Objective: Vital signs in last 24 hours: Temp:  [97.5 F (36.4 C)-98.2 F (36.8 C)] 98 F (36.7 C) (08/16 0639) Pulse Rate:  [60-91] 78 (08/16 0639) Resp:  [12-20] 18 (08/16 0639) BP: (103-157)/(57-76) 121/72 (08/16 0639) SpO2:  [92 %-100 %] 97 % (08/16 0639)  Intake/Output from previous day:  Intake/Output Summary (Last 24 hours) at 07/20/2021 0734 Last data filed at 07/20/2021 0600 Gross per 24 hour  Intake 3630.41 ml  Output 2920 ml  Net 710.41 ml     Intake/Output this shift: No intake/output data recorded.  Labs: Recent Labs    07/20/21 0253  HGB 13.6   Recent Labs    07/20/21 0253  WBC 15.0*  RBC 4.42  HCT 41.5  PLT 206   Recent Labs    07/20/21 0253  NA 134*  K 4.2  CL 102  CO2 25  BUN 15  CREATININE 1.07  GLUCOSE 142*  CALCIUM 8.6*   No results for input(s): LABPT, INR in the last 72 hours.  Exam: General - Patient is Alert and Oriented Extremity - Neurologically intact Neurovascular intact Sensation intact distally Dorsiflexion/Plantar flexion intact Dressing - dressing C/D/I Motor Function - intact, moving foot and toes well on exam.   Past Medical History:  Diagnosis Date   Arthritis    L knee   Bone spur    left hip   Cancer ([place])    prostate cancer   Complication of anesthesia    "stopped breathing with knee surgery-with following prostate surgery"   ED (erectile dysfunction)    GERD (gastroesophageal reflux disease)    Pneumonia    Ringing in ears    Seasonal allergies    Sleep apnea    cpap    Assessment/Plan: 1 Day Post-Op  Procedure(s) (LRB): TOTAL KNEE ARTHROPLASTY (Left) Principal Problem:   OA (osteoarthritis) of knee Active Problems:   Primary osteoarthritis of left knee  Estimated body mass index is 29.21 kg/m as calculated from the following:   Height as of this encounter: '5\' 6"'$  (1.676 m).   Weight as of this encounter: 82.1 kg. Advance diet Up with therapy D/C IV fluids   Patient's anticipated LOS is less than 2 midnights, meeting these requirements: - Younger than 57 - Lives within 1 hour of care - Has a competent adult at home to recover with post-op recover - NO history of  - Chronic pain requiring opiods  - Diabetes  - Coronary Artery Disease  - Heart failure  - Heart attack  - Stroke  - DVT/VTE  - Cardiac arrhythmia  - Respiratory Failure/COPD  - Renal failure  - Anemia  - Advanced Liver disease   DVT Prophylaxis - Xarelto Weight bearing as tolerated. Continue therapy.  Plan is to go Home after hospital stay. Plan for discharge later today if progresses with therapy and meeting his goals. Scheduled for OPPT at [facility] Follow-up in the office in 2 weeks  The PDMP database was reviewed today prior to any opioid medications being prescribed to this patient.  Theresa Duty, PA-C Orthopedic Surgery [phone] 07/20/2021, 7:34 AM

## 2021-07-20 NOTE — Progress Notes (Signed)
   07/20/21 1500  PT Visit Information  Last PT Received On 07/20/21 Pt progressing well. Ready to d/c home with family assist from PT standpoint   Assistance Needed +1  History of Present Illness Pt s/p L TKR and with hx of L THR, and prostate CA  Subjective Data  Patient Stated Goal Regain IND  Precautions  Precautions Fall;Knee  Required Braces or Orthoses Knee Immobilizer - Left  Knee Immobilizer - Left Discontinue once straight leg raise with < 10 degree lag  Restrictions  LLE Weight Bearing WBAT  Pain Assessment  Pain Assessment 0-10  Pain Score 3  Pain Location L knee  Pain Descriptors / Indicators Aching;Sore  Cognition  Arousal/Alertness Awake/alert  Behavior During Therapy WFL for tasks assessed/performed  Overall Cognitive Status Within Functional Limits for tasks assessed  Exercises  Exercises Total Joint  Total Joint Exercises  Ankle Circles/Pumps AROM;Both;15 reps;Supine  Quad Sets AROM;Both;15 reps  Heel Slides AAROM;Left;10 reps  Straight Leg Raises AROM;Left;10 reps  PT - End of Session  Equipment Utilized During Treatment Gait belt  Activity Tolerance Patient tolerated treatment well  Patient left in chair;with call bell/phone within reach;with chair alarm set  Nurse Communication Mobility status   PT - Assessment/Plan  PT Plan Current plan remains appropriate  PT Visit Diagnosis Difficulty in walking, not elsewhere classified (R26.2)  PT Frequency (ACUTE ONLY) 7X/week  Follow Up Recommendations Follow surgeon's recommendation for DC plan and follow-up therapies  PT equipment None recommended by PT  AM-PAC PT "6 Clicks" Mobility Outcome Measure (Version 2)  Help needed turning from your back to your side while in a flat bed without using bedrails? 3  Help needed moving from lying on your back to sitting on the side of a flat bed without using bedrails? 3  Help needed moving to and from a bed to a chair (including a wheelchair)? 3  Help needed standing  up from a chair using your arms (e.g., wheelchair or bedside chair)? 3  Help needed to walk in hospital room? 3  Help needed climbing 3-5 steps with a railing?  3  6 Click Score 18  Consider Recommendation of Discharge To: Home with [facility]  PT Goal Progression  Progress towards PT goals Progressing toward goals  Acute Rehab PT Goals  PT Goal Formulation With patient  Time For Goal Achievement 07/26/21  Potential to Achieve Goals Good  PT Time Calculation  PT Start Time (ACUTE ONLY) 1503  PT Stop Time (ACUTE ONLY) 1523  PT Time Calculation (min) (ACUTE ONLY) 20 min  PT General Charges  $$ ACUTE PT VISIT 1 Visit  PT Treatments  $Therapeutic Exercise 8-22 mins

## 2021-07-20 NOTE — Progress Notes (Signed)
Physical Therapy Treatment Patient Details Name: Benjamin Rivas MRN: [id number] DOB: 1946/04/02 Today's Date: 07/20/2021    History of Present Illness Pt s/p L TKR and with hx of L THR, and prostate CA    PT Comments    Pt progressing well today. Will see for a second session  and pt will be ready for d/c later today.   Follow Up Recommendations  Follow surgeon's recommendation for DC plan and follow-up therapies     Equipment Recommendations  None recommended by PT    Recommendations for Other Services       Precautions / Restrictions Precautions Precautions: Fall;Knee Required Braces or Orthoses: Knee Immobilizer - Left Knee Immobilizer - Left: Discontinue once straight leg raise with < 10 degree lag Restrictions LLE Weight Bearing: Weight bearing as tolerated    Mobility  Bed Mobility Overal bed mobility: Needs Assistance Bed Mobility: Supine to Sit     Supine to sit: Min assist     General bed mobility comments: min assist for L LE, use of rail, cues for sequence    Transfers Overall transfer level: Needs assistance Equipment used: Rolling walker (2 wheeled) Transfers: Sit to/from Stand Sit to Stand: Min assist         General transfer comment: cues for LE management and use of UEs to self assist  Ambulation/Gait Ambulation/Gait assistance: Min assist;Min guard Gait Distance (Feet): 280 Feet Assistive device: Rolling walker (2 wheeled) Gait Pattern/deviations: Step-to pattern;Decreased step length - right;Decreased step length - left;Shuffle;Trunk flexed Gait velocity: decr   General Gait Details: cues for posture, position from RW and initial sequence   Stairs Stairs: Yes Stairs assistance: Min assist;Min guard Stair Management: One rail Right;Step to pattern;Forwards;With cane Number of Stairs: 3 General stair comments: cues for sequence and technique, min/guard for safety   Wheelchair Mobility    Modified Rankin (Stroke Patients Only)        Balance Overall balance assessment: Needs assistance Sitting-balance support: No upper extremity supported;Feet supported Sitting balance-Leahy Scale: Good     Standing balance support: Single extremity supported Standing balance-Leahy Scale: Poor                              Cognition Arousal/Alertness: Awake/alert Behavior During Therapy: WFL for tasks assessed/performed Overall Cognitive Status: Within Functional Limits for tasks assessed                                        Exercises      General Comments        Pertinent Vitals/Pain Pain Assessment: 0-10 Pain Score: 3  Pain Location: L knee Pain Descriptors / Indicators: Aching;Sore Pain Intervention(s): Limited activity within patient's tolerance;Monitored during session;Premedicated before session;Repositioned;Ice applied    Home Living                      Prior Function            PT Goals (current goals can now be found in the care plan section) Acute Rehab PT Goals Patient Stated Goal: Regain IND PT Goal Formulation: With patient Time For Goal Achievement: 07/26/21 Potential to Achieve Goals: Good Progress towards PT goals: Progressing toward goals    Frequency    7X/week      PT Plan      Co-evaluation  AM-PAC PT "6 Clicks" Mobility   Outcome Measure  Help needed turning from your back to your side while in a flat bed without using bedrails?: A Little Help needed moving from lying on your back to sitting on the side of a flat bed without using bedrails?: A Little Help needed moving to and from a bed to a chair (including a wheelchair)?: A Little Help needed standing up from a chair using your arms (e.g., wheelchair or bedside chair)?: A Little Help needed to walk in hospital room?: A Little Help needed climbing 3-5 steps with a railing? : A Lot 6 Click Score: 17    End of Session Equipment Utilized During Treatment: Gait  belt Activity Tolerance: Patient tolerated treatment well Patient left: in chair;with call bell/phone within reach;with chair alarm set Nurse Communication: Mobility status PT Visit Diagnosis: Difficulty in walking, not elsewhere classified (R26.2)     Time: 1125-1150 PT Time Calculation (min) (ACUTE ONLY): 25 min  Charges:  $Gait Training: 23-37 mins                     Baxter Flattery, PT  Acute Rehab Dept ([place]) [phone] Pager [phone]  07/20/2021    [facility] 07/20/2021, 12:37 PM

## 2021-07-21 NOTE — Discharge Summary (Signed)
Physician Discharge Summary   Patient ID: Benjamin Rivas MRN: [id number] DOB/AGE: [birth year] 74 y.o.  Admit date: 07/19/2021 Discharge date: 07/20/2021  Primary Diagnosis: Osteoarthritis, left knee   Admission Diagnoses:  Past Medical History:  Diagnosis Date   Arthritis    L knee   Bone spur    left hip   Cancer ([place])    prostate cancer   Complication of anesthesia    "stopped breathing with knee surgery-with following prostate surgery"   ED (erectile dysfunction)    GERD (gastroesophageal reflux disease)    Pneumonia    Ringing in ears    Seasonal allergies    Sleep apnea    cpap   Discharge Diagnoses:   Principal Problem:   OA (osteoarthritis) of knee Active Problems:   Primary osteoarthritis of left knee  Estimated body mass index is 29.21 kg/m as calculated from the following:   Height as of this encounter: '5\' 6"'$  (1.676 m).   Weight as of this encounter: 82.1 kg.  Procedure:  Procedure(s) (LRB): TOTAL KNEE ARTHROPLASTY (Left)   Consults: None  HPI: Benjamin Rivas is a 75 y.o. year old male with end stage OA of his left knee with progressively worsening pain and dysfunction. He has constant pain, with activity and at rest and significant functional deficits with difficulties even with ADLs. He has had extensive non-op management including analgesics, injections of cortisone and viscosupplements, and home exercise program, but remains in significant pain with significant dysfunction. Radiographs show bone on bone arthritis medial and patellofemoral. He presents now for left Total Knee Arthroplasty.   Laboratory Data: Admission on 07/19/2021, Discharged on 07/20/2021  Component Date Value Ref Range Status   WBC 07/20/2021 15.0 (A) 4.0 - 10.5 K/uL Final   RBC 07/20/2021 4.42  4.22 - 5.81 MIL/uL Final   Hemoglobin 07/20/2021 13.6  13.0 - 17.0 g/dL Final   HCT 07/20/2021 41.5  39.0 - 52.0 % Final   MCV 07/20/2021 93.9  80.0 - 100.0 fL Final   MCH 07/20/2021 30.8   26.0 - 34.0 pg Final   MCHC 07/20/2021 32.8  30.0 - 36.0 g/dL Final   RDW 07/20/2021 13.0  11.5 - 15.5 % Final   Platelets 07/20/2021 206  150 - 400 K/uL Final   nRBC 07/20/2021 0.0  0.0 - 0.2 % Final   Performed at [facility], [place] [address] [place], [place] [address]   Sodium 07/20/2021 134 (A) 135 - 145 mmol/L Final   Potassium 07/20/2021 4.2  3.5 - 5.1 mmol/L Final   Chloride 07/20/2021 102  98 - 111 mmol/L Final   CO2 07/20/2021 25  22 - 32 mmol/L Final   Glucose, Bld 07/20/2021 142 (A) 70 - 99 mg/dL Final   Glucose reference range applies only to samples taken after fasting for at least 8 hours.   BUN 07/20/2021 15  8 - 23 mg/dL Final   Creatinine, Ser 07/20/2021 1.07  0.61 - 1.24 mg/dL Final   Calcium 07/20/2021 8.6 (A) 8.9 - 10.3 mg/dL Final   GFR, Estimated 07/20/2021 >60  >60 mL/min Final   Comment: (NOTE) Calculated using the CKD-EPI Creatinine Equation (2021)    Anion gap 07/20/2021 7  5 - 15 Final   Performed at [facility], [place] [address] [place], [place] [address]   Glucose-Capillary 07/19/2021 193 (A) 70 - 99 mg/dL Final   Glucose reference range applies only to samples taken after fasting for at least 8 hours.  Orders Only on 07/15/2021  Component Date Value Ref Range Status   SARS Coronavirus 2 07/15/2021 RESULT: NEGATIVE   Final   Comment: RESULT: NEGATIVESARS-CoV-2 INTERPRETATION:A NEGATIVE  test result means that SARS-CoV-2 RNA was not present in the specimen above the limit of detection of this test. This does not preclude a possible SARS-CoV-2 infection and should not be used as the  sole basis for patient management decisions. Negative results must be combined with clinical observations, patient history, and epidemiological information. Optimum specimen types and timing for peak viral levels during infections caused by SARS-CoV-2  have not been determined. Collection of multiple specimens or types of specimens may be  necessary to detect virus. Improper specimen collection and handling, sequence variability under primers/probes, or organism present below the limit of detection may  lead to false negative results. Positive and negative predictive values of testing are highly dependent on prevalence. False negative test results are more likely when prevalence of disease is high.The expected result is NEGATIVE.Fact S                          heet for  Healthcare Providers: LocalChronicle.no Sheet for Patients: SalonLookup.es Reference Range - Negative   Hospital Outpatient Visit on 07/07/2021  Component Date Value Ref Range Status   MRSA, PCR 07/07/2021 NEGATIVE  NEGATIVE Final   Staphylococcus aureus 07/07/2021 NEGATIVE  NEGATIVE Final   Comment: (NOTE) The Xpert SA Assay (FDA approved for NASAL specimens in patients 76 years of age and older), is one component of a comprehensive surveillance program. It is not intended to diagnose infection nor to guide or monitor treatment. Performed at [facility], [place] [address] [place], [place] [address]    WBC 07/07/2021 7.6  4.0 - 10.5 K/uL Final   RBC 07/07/2021 5.03  4.22 - 5.81 MIL/uL Final   Hemoglobin 07/07/2021 15.6  13.0 - 17.0 g/dL Final   HCT 07/07/2021 48.4  39.0 - 52.0 % Final   MCV 07/07/2021 96.2  80.0 - 100.0 fL Final   MCH 07/07/2021 31.0  26.0 - 34.0 pg Final   MCHC 07/07/2021 32.2  30.0 - 36.0 g/dL Final   RDW 07/07/2021 13.1  11.5 - 15.5 % Final   Platelets 07/07/2021 216  150 - 400 K/uL Final   nRBC 07/07/2021 0.0  0.0 - 0.2 % Final   Performed at [facility], [place] [address] [place], [place] [address]   Sodium 07/07/2021 140  135 - 145 mmol/L Final   Potassium 07/07/2021 4.6  3.5 - 5.1 mmol/L Final   Chloride 07/07/2021 103  98 - 111 mmol/L Final   CO2 07/07/2021 28  22 - 32 mmol/L Final   Glucose, Bld 07/07/2021 107 (A) 70 - 99 mg/dL Final    Glucose reference range applies only to samples taken after fasting for at least 8 hours.   BUN 07/07/2021 14  8 - 23 mg/dL Final   Creatinine, Ser 07/07/2021 1.14  0.61 - 1.24 mg/dL Final   Calcium 07/07/2021 9.7  8.9 - 10.3 mg/dL Final   Total Protein 07/07/2021 7.4  6.5 - 8.1 g/dL Final   Albumin 07/07/2021 4.4  3.5 - 5.0 g/dL Final   AST 07/07/2021 23  15 - 41 U/L Final   ALT 07/07/2021 21  0 - 44 U/L Final   Alkaline Phosphatase 07/07/2021 63  38 - 126 U/L Final   Total Bilirubin 07/07/2021 1.3 (A) 0.3 - 1.2  mg/dL Final   GFR, Estimated 07/07/2021 >60  >60 mL/min Final   Comment: (NOTE) Calculated using the CKD-EPI Creatinine Equation (2021)    Anion gap 07/07/2021 9  5 - 15 Final   Performed at [facility], [place] [address], [place], [place] [address]   Prothrombin Time 07/07/2021 12.5  11.4 - 15.2 seconds Final   INR 07/07/2021 0.9  0.8 - 1.2 Final   Comment: (NOTE) INR goal varies based on device and disease states. Performed at [facility], [place] [address], [place], [place] [address]    ABO/RH(D) 07/07/2021 A POS   Final   Antibody Screen 07/07/2021 NEG   Final   Sample Expiration 07/07/2021 07/21/2021,2359   Final   Extend sample reason 07/07/2021    Final                   Value:NO TRANSFUSIONS OR PREGNANCY IN THE PAST 3 MONTHS Performed at [facility], [place] [address], [place], [place] [address]      X-Rays:No results found.  EKG: Orders placed or performed during the hospital encounter of 04/07/15   EKG 12-Lead   EKG 12-Lead     Hospital Course: Benjamin Rivas is a 75 y.o. who was admitted to [facility]. They were brought to the operating room on 07/19/2021 and underwent Procedure(s): TOTAL KNEE ARTHROPLASTY.  Patient tolerated the procedure well and was later transferred to the recovery room and then to the orthopaedic floor for postoperative care. They were given PO and IV analgesics for pain  control following their surgery. They were given 24 hours of postoperative antibiotics of  Anti-infectives (From admission, onward)    Start     Dose/Rate Route Frequency Ordered Stop   07/19/21 1530  ceFAZolin (ANCEF) IVPB 2g/100 mL premix        2 g 200 mL/hr over 30 Minutes Intravenous Every 6 hours 07/19/21 1410 07/19/21 2240   07/19/21 0715  ceFAZolin (ANCEF) IVPB 2g/100 mL premix        2 g 200 mL/hr over 30 Minutes Intravenous On call to O.R. 07/19/21 0703 07/19/21 1003      and started on DVT prophylaxis in the form of Xarelto.   PT and OT were ordered for total joint protocol. Discharge planning consulted to help with postop disposition and equipment needs.  Patient had a good night on the evening of surgery. They started to get up OOB with therapy on POD #0. Pt was seen during rounds and was ready to go home pending progress with therapy. He worked with therapy on POD #1 and was meeting his goals. Pt was discharged to home later that day in stable condition.  Diet: Regular diet Activity: WBAT Follow-up: in 2 weeks Disposition: Home Discharged Condition: stable   Discharge Instructions     Call MD / Call 911   Complete by: As directed    If you experience chest pain or shortness of breath, CALL 911 and be transported to the hospital emergency room.  If you develope a fever above 101 F, pus (white drainage) or increased drainage or redness at the wound, or calf pain, call your surgeon's office.   Change dressing   Complete by: As directed    You may remove the bulky bandage (ACE wrap and gauze) two days after surgery. You will have an adhesive waterproof bandage underneath. Leave this in place until your first follow-up appointment.   Constipation Prevention  Complete by: As directed    Drink plenty of fluids.  Prune juice may be helpful.  You may use a stool softener, such as Colace (over the counter) 100 mg twice a day.  Use MiraLax (over the counter) for constipation as  needed.   Diet - low sodium heart healthy   Complete by: As directed    Do not put a pillow under the knee. Place it under the heel.   Complete by: As directed    Driving restrictions   Complete by: As directed    No driving for two weeks   Post-operative opioid taper instructions:   Complete by: As directed    POST-OPERATIVE OPIOID TAPER INSTRUCTIONS: It is important to wean off of your opioid medication as soon as possible. If you do not need pain medication after your surgery it is ok to stop day one. Opioids include: Codeine, Hydrocodone(Norco, Vicodin), Oxycodone(Percocet, oxycontin) and hydromorphone amongst others.  Long term and even short term use of opiods can cause: Increased pain response Dependence Constipation Depression Respiratory depression And more.  Withdrawal symptoms can include Flu like symptoms Nausea, vomiting And more Techniques to manage these symptoms Hydrate well Eat regular healthy meals Stay active Use relaxation techniques(deep breathing, meditating, yoga) Do Not substitute Alcohol to help with tapering If you have been on opioids for less than two weeks and do not have pain than it is ok to stop all together.  Plan to wean off of opioids This plan should start within one week post op of your joint replacement. Maintain the same interval or time between taking each dose and first decrease the dose.  Cut the total daily intake of opioids by one tablet each day Next start to increase the time between doses. The last dose that should be eliminated is the evening dose.      TED hose   Complete by: As directed    Use stockings (TED hose) for three weeks on both leg(s).  You may remove them at night for sleeping.   Weight bearing as tolerated   Complete by: As directed       Allergies as of 07/20/2021   No Known Allergies      Medication List     STOP taking these medications    BLUE-EMU PAIN RELIEF DRY EX   ergocalciferol 1.25 MG  (50000 UT) capsule Commonly known as: VITAMIN D2   melatonin 5 MG Tabs   multivitamin with minerals Tabs tablet   VITAMIN C PO   zinc gluconate 50 MG tablet       TAKE these medications    famotidine 20 MG tablet Commonly known as: PEPCID Take 20 mg by mouth daily as needed for heartburn or indigestion.   gabapentin 300 MG capsule Commonly known as: NEURONTIN Take a 300 mg capsule three times a day for two weeks following surgery.Then take a 300 mg capsule two times a day for two weeks. Then take a 300 mg capsule once a day for two weeks. Then discontinue.   methocarbamol 500 MG tablet Commonly known as: ROBAXIN Take 1 tablet (500 mg total) by mouth every 6 (six) hours as needed for muscle spasms.   NASACORT AQ NA Place 1 spray into the nose daily as needed (allergies).   oxyCODONE 5 MG immediate release tablet Commonly known as: Oxy IR/ROXICODONE Take 1-2 tablets (5-10 mg total) by mouth every 6 (six) hours as needed for severe pain. Not to exceed 6 tablets a day.  REFRESH OP Place 1 drop into both eyes 2 (two) times daily as needed (dry eyes).   rivaroxaban 10 MG Tabs tablet Commonly known as: XARELTO Take 1 tablet (10 mg total) by mouth daily with breakfast for 20 days. Then take one 81 mg aspirin once a day for three weeks. Then discontinue aspirin.   traMADol 50 MG tablet Commonly known as: ULTRAM Take 1-2 tablets (50-100 mg total) by mouth every 6 (six) hours as needed for moderate pain.               Discharge Care Instructions  (From admission, onward)           Start     Ordered   07/20/21 0000  Weight bearing as tolerated        07/20/21 0738   07/20/21 0000  Change dressing       Comments: You may remove the bulky bandage (ACE wrap and gauze) two days after surgery. You will have an adhesive waterproof bandage underneath. Leave this in place until your first follow-up appointment.   07/20/21 [id number]            Follow-up Information      Gaynelle Arabian, MD Follow up.   Specialty: Orthopedic Surgery Why: You are scheduled for a follow-up appointment on 08/03/21 at 4:45 pm Contact information: [address] [place] [address] [id number]                 Signed: Theresa Duty, PA-C Orthopedic Surgery 07/21/2021, 3:12 PM

## 2021-07-22 DIAGNOSIS — Z96652 Presence of left artificial knee joint: Secondary | ICD-10-CM | POA: Diagnosis not present

## 2021-07-22 DIAGNOSIS — M62552 Muscle wasting and atrophy, not elsewhere classified, left thigh: Secondary | ICD-10-CM | POA: Diagnosis not present

## 2021-07-22 DIAGNOSIS — M25562 Pain in left knee: Secondary | ICD-10-CM | POA: Diagnosis not present

## 2021-07-22 DIAGNOSIS — M25462 Effusion, left knee: Secondary | ICD-10-CM | POA: Diagnosis not present

## 2021-07-22 DIAGNOSIS — R2689 Other abnormalities of gait and mobility: Secondary | ICD-10-CM | POA: Diagnosis not present

## 2021-07-26 DIAGNOSIS — M25562 Pain in left knee: Secondary | ICD-10-CM | POA: Diagnosis not present

## 2021-07-26 DIAGNOSIS — M62552 Muscle wasting and atrophy, not elsewhere classified, left thigh: Secondary | ICD-10-CM | POA: Diagnosis not present

## 2021-07-26 DIAGNOSIS — R2689 Other abnormalities of gait and mobility: Secondary | ICD-10-CM | POA: Diagnosis not present

## 2021-07-26 DIAGNOSIS — M25462 Effusion, left knee: Secondary | ICD-10-CM | POA: Diagnosis not present

## 2021-07-26 DIAGNOSIS — Z96652 Presence of left artificial knee joint: Secondary | ICD-10-CM | POA: Diagnosis not present

## 2021-07-28 DIAGNOSIS — M62552 Muscle wasting and atrophy, not elsewhere classified, left thigh: Secondary | ICD-10-CM | POA: Diagnosis not present

## 2021-07-28 DIAGNOSIS — Z96652 Presence of left artificial knee joint: Secondary | ICD-10-CM | POA: Diagnosis not present

## 2021-07-28 DIAGNOSIS — R2689 Other abnormalities of gait and mobility: Secondary | ICD-10-CM | POA: Diagnosis not present

## 2021-07-28 DIAGNOSIS — M25462 Effusion, left knee: Secondary | ICD-10-CM | POA: Diagnosis not present

## 2021-07-28 DIAGNOSIS — M25562 Pain in left knee: Secondary | ICD-10-CM | POA: Diagnosis not present

## 2021-07-30 DIAGNOSIS — R2689 Other abnormalities of gait and mobility: Secondary | ICD-10-CM | POA: Diagnosis not present

## 2021-07-30 DIAGNOSIS — M25462 Effusion, left knee: Secondary | ICD-10-CM | POA: Diagnosis not present

## 2021-07-30 DIAGNOSIS — Z96652 Presence of left artificial knee joint: Secondary | ICD-10-CM | POA: Diagnosis not present

## 2021-07-30 DIAGNOSIS — M62552 Muscle wasting and atrophy, not elsewhere classified, left thigh: Secondary | ICD-10-CM | POA: Diagnosis not present

## 2021-07-30 DIAGNOSIS — M25562 Pain in left knee: Secondary | ICD-10-CM | POA: Diagnosis not present

## 2021-08-02 DIAGNOSIS — M25462 Effusion, left knee: Secondary | ICD-10-CM | POA: Diagnosis not present

## 2021-08-02 DIAGNOSIS — Z96652 Presence of left artificial knee joint: Secondary | ICD-10-CM | POA: Diagnosis not present

## 2021-08-02 DIAGNOSIS — R2689 Other abnormalities of gait and mobility: Secondary | ICD-10-CM | POA: Diagnosis not present

## 2021-08-02 DIAGNOSIS — M62552 Muscle wasting and atrophy, not elsewhere classified, left thigh: Secondary | ICD-10-CM | POA: Diagnosis not present

## 2021-08-02 DIAGNOSIS — M25562 Pain in left knee: Secondary | ICD-10-CM | POA: Diagnosis not present

## 2021-08-04 DIAGNOSIS — M62552 Muscle wasting and atrophy, not elsewhere classified, left thigh: Secondary | ICD-10-CM | POA: Diagnosis not present

## 2021-08-04 DIAGNOSIS — Z96652 Presence of left artificial knee joint: Secondary | ICD-10-CM | POA: Diagnosis not present

## 2021-08-04 DIAGNOSIS — M25462 Effusion, left knee: Secondary | ICD-10-CM | POA: Diagnosis not present

## 2021-08-04 DIAGNOSIS — R2689 Other abnormalities of gait and mobility: Secondary | ICD-10-CM | POA: Diagnosis not present

## 2021-08-04 DIAGNOSIS — M25562 Pain in left knee: Secondary | ICD-10-CM | POA: Diagnosis not present

## 2021-08-06 DIAGNOSIS — M25462 Effusion, left knee: Secondary | ICD-10-CM | POA: Diagnosis not present

## 2021-08-06 DIAGNOSIS — M25562 Pain in left knee: Secondary | ICD-10-CM | POA: Diagnosis not present

## 2021-08-06 DIAGNOSIS — M62552 Muscle wasting and atrophy, not elsewhere classified, left thigh: Secondary | ICD-10-CM | POA: Diagnosis not present

## 2021-08-06 DIAGNOSIS — R2689 Other abnormalities of gait and mobility: Secondary | ICD-10-CM | POA: Diagnosis not present

## 2021-08-06 DIAGNOSIS — Z96652 Presence of left artificial knee joint: Secondary | ICD-10-CM | POA: Diagnosis not present

## 2021-08-10 DIAGNOSIS — M62552 Muscle wasting and atrophy, not elsewhere classified, left thigh: Secondary | ICD-10-CM | POA: Diagnosis not present

## 2021-08-10 DIAGNOSIS — M25462 Effusion, left knee: Secondary | ICD-10-CM | POA: Diagnosis not present

## 2021-08-10 DIAGNOSIS — M25562 Pain in left knee: Secondary | ICD-10-CM | POA: Diagnosis not present

## 2021-08-10 DIAGNOSIS — Z96652 Presence of left artificial knee joint: Secondary | ICD-10-CM | POA: Diagnosis not present

## 2021-08-10 DIAGNOSIS — R2689 Other abnormalities of gait and mobility: Secondary | ICD-10-CM | POA: Diagnosis not present

## 2021-08-11 DIAGNOSIS — M25562 Pain in left knee: Secondary | ICD-10-CM | POA: Diagnosis not present

## 2021-08-11 DIAGNOSIS — R2689 Other abnormalities of gait and mobility: Secondary | ICD-10-CM | POA: Diagnosis not present

## 2021-08-11 DIAGNOSIS — M25462 Effusion, left knee: Secondary | ICD-10-CM | POA: Diagnosis not present

## 2021-08-11 DIAGNOSIS — M62552 Muscle wasting and atrophy, not elsewhere classified, left thigh: Secondary | ICD-10-CM | POA: Diagnosis not present

## 2021-08-11 DIAGNOSIS — Z96652 Presence of left artificial knee joint: Secondary | ICD-10-CM | POA: Diagnosis not present

## 2021-08-12 DIAGNOSIS — M25462 Effusion, left knee: Secondary | ICD-10-CM | POA: Diagnosis not present

## 2021-08-12 DIAGNOSIS — Z96652 Presence of left artificial knee joint: Secondary | ICD-10-CM | POA: Diagnosis not present

## 2021-08-12 DIAGNOSIS — M25562 Pain in left knee: Secondary | ICD-10-CM | POA: Diagnosis not present

## 2021-08-12 DIAGNOSIS — R2689 Other abnormalities of gait and mobility: Secondary | ICD-10-CM | POA: Diagnosis not present

## 2021-08-12 DIAGNOSIS — M62552 Muscle wasting and atrophy, not elsewhere classified, left thigh: Secondary | ICD-10-CM | POA: Diagnosis not present

## 2021-08-16 DIAGNOSIS — M25562 Pain in left knee: Secondary | ICD-10-CM | POA: Diagnosis not present

## 2021-08-16 DIAGNOSIS — Z96652 Presence of left artificial knee joint: Secondary | ICD-10-CM | POA: Diagnosis not present

## 2021-08-16 DIAGNOSIS — M25462 Effusion, left knee: Secondary | ICD-10-CM | POA: Diagnosis not present

## 2021-08-16 DIAGNOSIS — R2689 Other abnormalities of gait and mobility: Secondary | ICD-10-CM | POA: Diagnosis not present

## 2021-08-16 DIAGNOSIS — M62552 Muscle wasting and atrophy, not elsewhere classified, left thigh: Secondary | ICD-10-CM | POA: Diagnosis not present

## 2021-08-20 DIAGNOSIS — M25462 Effusion, left knee: Secondary | ICD-10-CM | POA: Diagnosis not present

## 2021-08-20 DIAGNOSIS — R2689 Other abnormalities of gait and mobility: Secondary | ICD-10-CM | POA: Diagnosis not present

## 2021-08-20 DIAGNOSIS — M25562 Pain in left knee: Secondary | ICD-10-CM | POA: Diagnosis not present

## 2021-08-20 DIAGNOSIS — Z96652 Presence of left artificial knee joint: Secondary | ICD-10-CM | POA: Diagnosis not present

## 2021-08-20 DIAGNOSIS — M62552 Muscle wasting and atrophy, not elsewhere classified, left thigh: Secondary | ICD-10-CM | POA: Diagnosis not present

## 2021-08-23 ENCOUNTER — Encounter: Payer: Self-pay | Admitting: Gastroenterology

## 2021-08-23 DIAGNOSIS — M25562 Pain in left knee: Secondary | ICD-10-CM | POA: Diagnosis not present

## 2021-08-23 DIAGNOSIS — M25462 Effusion, left knee: Secondary | ICD-10-CM | POA: Diagnosis not present

## 2021-08-23 DIAGNOSIS — M62552 Muscle wasting and atrophy, not elsewhere classified, left thigh: Secondary | ICD-10-CM | POA: Diagnosis not present

## 2021-08-23 DIAGNOSIS — Z96652 Presence of left artificial knee joint: Secondary | ICD-10-CM | POA: Diagnosis not present

## 2021-08-23 DIAGNOSIS — R2689 Other abnormalities of gait and mobility: Secondary | ICD-10-CM | POA: Diagnosis not present

## 2021-08-24 DIAGNOSIS — Z471 Aftercare following joint replacement surgery: Secondary | ICD-10-CM | POA: Diagnosis not present

## 2021-08-24 DIAGNOSIS — Z96652 Presence of left artificial knee joint: Secondary | ICD-10-CM | POA: Diagnosis not present

## 2021-08-26 DIAGNOSIS — R2689 Other abnormalities of gait and mobility: Secondary | ICD-10-CM | POA: Diagnosis not present

## 2021-08-26 DIAGNOSIS — M25562 Pain in left knee: Secondary | ICD-10-CM | POA: Diagnosis not present

## 2021-08-26 DIAGNOSIS — M62552 Muscle wasting and atrophy, not elsewhere classified, left thigh: Secondary | ICD-10-CM | POA: Diagnosis not present

## 2021-08-26 DIAGNOSIS — Z96652 Presence of left artificial knee joint: Secondary | ICD-10-CM | POA: Diagnosis not present

## 2021-08-26 DIAGNOSIS — M25462 Effusion, left knee: Secondary | ICD-10-CM | POA: Diagnosis not present

## 2021-08-31 DIAGNOSIS — M25462 Effusion, left knee: Secondary | ICD-10-CM | POA: Diagnosis not present

## 2021-08-31 DIAGNOSIS — M25562 Pain in left knee: Secondary | ICD-10-CM | POA: Diagnosis not present

## 2021-08-31 DIAGNOSIS — Z96652 Presence of left artificial knee joint: Secondary | ICD-10-CM | POA: Diagnosis not present

## 2021-08-31 DIAGNOSIS — R2689 Other abnormalities of gait and mobility: Secondary | ICD-10-CM | POA: Diagnosis not present

## 2021-08-31 DIAGNOSIS — M62552 Muscle wasting and atrophy, not elsewhere classified, left thigh: Secondary | ICD-10-CM | POA: Diagnosis not present

## 2021-09-03 DIAGNOSIS — M62552 Muscle wasting and atrophy, not elsewhere classified, left thigh: Secondary | ICD-10-CM | POA: Diagnosis not present

## 2021-09-03 DIAGNOSIS — Z96652 Presence of left artificial knee joint: Secondary | ICD-10-CM | POA: Diagnosis not present

## 2021-09-03 DIAGNOSIS — M25562 Pain in left knee: Secondary | ICD-10-CM | POA: Diagnosis not present

## 2021-09-03 DIAGNOSIS — R2689 Other abnormalities of gait and mobility: Secondary | ICD-10-CM | POA: Diagnosis not present

## 2021-09-03 DIAGNOSIS — M25462 Effusion, left knee: Secondary | ICD-10-CM | POA: Diagnosis not present

## 2021-09-07 DIAGNOSIS — R2689 Other abnormalities of gait and mobility: Secondary | ICD-10-CM | POA: Diagnosis not present

## 2021-09-07 DIAGNOSIS — M62552 Muscle wasting and atrophy, not elsewhere classified, left thigh: Secondary | ICD-10-CM | POA: Diagnosis not present

## 2021-09-07 DIAGNOSIS — M25462 Effusion, left knee: Secondary | ICD-10-CM | POA: Diagnosis not present

## 2021-09-07 DIAGNOSIS — M25562 Pain in left knee: Secondary | ICD-10-CM | POA: Diagnosis not present

## 2021-09-07 DIAGNOSIS — Z96652 Presence of left artificial knee joint: Secondary | ICD-10-CM | POA: Diagnosis not present

## 2021-09-09 DIAGNOSIS — M62552 Muscle wasting and atrophy, not elsewhere classified, left thigh: Secondary | ICD-10-CM | POA: Diagnosis not present

## 2021-09-09 DIAGNOSIS — M25462 Effusion, left knee: Secondary | ICD-10-CM | POA: Diagnosis not present

## 2021-09-09 DIAGNOSIS — Z96652 Presence of left artificial knee joint: Secondary | ICD-10-CM | POA: Diagnosis not present

## 2021-09-09 DIAGNOSIS — M25562 Pain in left knee: Secondary | ICD-10-CM | POA: Diagnosis not present

## 2021-09-09 DIAGNOSIS — R2689 Other abnormalities of gait and mobility: Secondary | ICD-10-CM | POA: Diagnosis not present

## 2021-09-15 DIAGNOSIS — Z23 Encounter for immunization: Secondary | ICD-10-CM | POA: Diagnosis not present

## 2021-09-15 DIAGNOSIS — E7800 Pure hypercholesterolemia, unspecified: Secondary | ICD-10-CM | POA: Diagnosis not present

## 2021-09-15 DIAGNOSIS — R7303 Prediabetes: Secondary | ICD-10-CM | POA: Diagnosis not present

## 2021-09-15 DIAGNOSIS — Z0000 Encounter for general adult medical examination without abnormal findings: Secondary | ICD-10-CM | POA: Diagnosis not present

## 2021-09-15 DIAGNOSIS — G4733 Obstructive sleep apnea (adult) (pediatric): Secondary | ICD-10-CM | POA: Diagnosis not present

## 2021-09-15 DIAGNOSIS — K219 Gastro-esophageal reflux disease without esophagitis: Secondary | ICD-10-CM | POA: Diagnosis not present

## 2021-09-15 DIAGNOSIS — Z79899 Other long term (current) drug therapy: Secondary | ICD-10-CM | POA: Diagnosis not present

## 2021-09-15 DIAGNOSIS — Z1331 Encounter for screening for depression: Secondary | ICD-10-CM | POA: Diagnosis not present

## 2021-09-15 DIAGNOSIS — Z8546 Personal history of malignant neoplasm of prostate: Secondary | ICD-10-CM | POA: Diagnosis not present

## 2021-10-04 DIAGNOSIS — H2513 Age-related nuclear cataract, bilateral: Secondary | ICD-10-CM | POA: Diagnosis not present

## 2021-10-04 DIAGNOSIS — H5203 Hypermetropia, bilateral: Secondary | ICD-10-CM | POA: Diagnosis not present

## 2021-10-04 DIAGNOSIS — H524 Presbyopia: Secondary | ICD-10-CM | POA: Diagnosis not present

## 2021-10-05 DIAGNOSIS — H524 Presbyopia: Secondary | ICD-10-CM | POA: Diagnosis not present

## 2021-10-05 DIAGNOSIS — H52209 Unspecified astigmatism, unspecified eye: Secondary | ICD-10-CM | POA: Diagnosis not present

## 2021-10-05 DIAGNOSIS — H5203 Hypermetropia, bilateral: Secondary | ICD-10-CM | POA: Diagnosis not present

## 2022-01-18 DIAGNOSIS — J069 Acute upper respiratory infection, unspecified: Secondary | ICD-10-CM | POA: Diagnosis not present

## 2022-01-18 DIAGNOSIS — Z20828 Contact with and (suspected) exposure to other viral communicable diseases: Secondary | ICD-10-CM | POA: Diagnosis not present

## 2022-02-03 DIAGNOSIS — Z6829 Body mass index (BMI) 29.0-29.9, adult: Secondary | ICD-10-CM | POA: Diagnosis not present

## 2022-02-03 DIAGNOSIS — J329 Chronic sinusitis, unspecified: Secondary | ICD-10-CM | POA: Diagnosis not present

## 2022-03-07 DIAGNOSIS — D2239 Melanocytic nevi of other parts of face: Secondary | ICD-10-CM | POA: Diagnosis not present

## 2022-03-07 DIAGNOSIS — L570 Actinic keratosis: Secondary | ICD-10-CM | POA: Diagnosis not present

## 2022-03-07 DIAGNOSIS — L821 Other seborrheic keratosis: Secondary | ICD-10-CM | POA: Diagnosis not present

## 2022-03-07 DIAGNOSIS — L820 Inflamed seborrheic keratosis: Secondary | ICD-10-CM | POA: Diagnosis not present

## 2022-03-07 DIAGNOSIS — D485 Neoplasm of uncertain behavior of skin: Secondary | ICD-10-CM | POA: Diagnosis not present

## 2022-03-07 DIAGNOSIS — D225 Melanocytic nevi of trunk: Secondary | ICD-10-CM | POA: Diagnosis not present

## 2022-07-28 DIAGNOSIS — Z96652 Presence of left artificial knee joint: Secondary | ICD-10-CM | POA: Diagnosis not present

## 2022-09-10 DIAGNOSIS — L570 Actinic keratosis: Secondary | ICD-10-CM | POA: Diagnosis not present

## 2022-09-10 DIAGNOSIS — D485 Neoplasm of uncertain behavior of skin: Secondary | ICD-10-CM | POA: Diagnosis not present

## 2022-09-10 DIAGNOSIS — D225 Melanocytic nevi of trunk: Secondary | ICD-10-CM | POA: Diagnosis not present

## 2022-09-10 DIAGNOSIS — D2239 Melanocytic nevi of other parts of face: Secondary | ICD-10-CM | POA: Diagnosis not present

## 2022-09-10 DIAGNOSIS — L820 Inflamed seborrheic keratosis: Secondary | ICD-10-CM | POA: Diagnosis not present

## 2022-09-19 DIAGNOSIS — Z9181 History of falling: Secondary | ICD-10-CM | POA: Diagnosis not present

## 2022-09-19 DIAGNOSIS — E7800 Pure hypercholesterolemia, unspecified: Secondary | ICD-10-CM | POA: Diagnosis not present

## 2022-09-19 DIAGNOSIS — Z0000 Encounter for general adult medical examination without abnormal findings: Secondary | ICD-10-CM | POA: Diagnosis not present

## 2022-09-19 DIAGNOSIS — K219 Gastro-esophageal reflux disease without esophagitis: Secondary | ICD-10-CM | POA: Diagnosis not present

## 2022-09-19 DIAGNOSIS — R7303 Prediabetes: Secondary | ICD-10-CM | POA: Diagnosis not present

## 2022-09-19 DIAGNOSIS — Z79899 Other long term (current) drug therapy: Secondary | ICD-10-CM | POA: Diagnosis not present

## 2022-09-19 DIAGNOSIS — Z8546 Personal history of malignant neoplasm of prostate: Secondary | ICD-10-CM | POA: Diagnosis not present

## 2022-09-19 DIAGNOSIS — Z23 Encounter for immunization: Secondary | ICD-10-CM | POA: Diagnosis not present

## 2022-09-19 DIAGNOSIS — Z6830 Body mass index (BMI) 30.0-30.9, adult: Secondary | ICD-10-CM | POA: Diagnosis not present

## 2022-10-11 DIAGNOSIS — H2513 Age-related nuclear cataract, bilateral: Secondary | ICD-10-CM | POA: Diagnosis not present

## 2022-10-11 DIAGNOSIS — H524 Presbyopia: Secondary | ICD-10-CM | POA: Diagnosis not present

## 2022-10-11 DIAGNOSIS — H5203 Hypermetropia, bilateral: Secondary | ICD-10-CM | POA: Diagnosis not present

## 2022-12-06 DIAGNOSIS — J329 Chronic sinusitis, unspecified: Secondary | ICD-10-CM | POA: Diagnosis not present

## 2023-03-10 DIAGNOSIS — L821 Other seborrheic keratosis: Secondary | ICD-10-CM | POA: Diagnosis not present

## 2023-03-10 DIAGNOSIS — D2239 Melanocytic nevi of other parts of face: Secondary | ICD-10-CM | POA: Diagnosis not present

## 2023-03-10 DIAGNOSIS — L570 Actinic keratosis: Secondary | ICD-10-CM | POA: Diagnosis not present

## 2023-03-10 DIAGNOSIS — D225 Melanocytic nevi of trunk: Secondary | ICD-10-CM | POA: Diagnosis not present

## 2023-06-23 DIAGNOSIS — L570 Actinic keratosis: Secondary | ICD-10-CM | POA: Diagnosis not present

## 2023-06-23 DIAGNOSIS — D225 Melanocytic nevi of trunk: Secondary | ICD-10-CM | POA: Diagnosis not present

## 2023-06-23 DIAGNOSIS — D2239 Melanocytic nevi of other parts of face: Secondary | ICD-10-CM | POA: Diagnosis not present

## 2023-09-14 DIAGNOSIS — Z6830 Body mass index (BMI) 30.0-30.9, adult: Secondary | ICD-10-CM | POA: Diagnosis not present

## 2023-09-14 DIAGNOSIS — J0120 Acute ethmoidal sinusitis, unspecified: Secondary | ICD-10-CM | POA: Diagnosis not present

## 2023-09-14 DIAGNOSIS — J309 Allergic rhinitis, unspecified: Secondary | ICD-10-CM | POA: Diagnosis not present

## 2023-09-21 DIAGNOSIS — G4733 Obstructive sleep apnea (adult) (pediatric): Secondary | ICD-10-CM | POA: Diagnosis not present

## 2023-09-21 DIAGNOSIS — Z1339 Encounter for screening examination for other mental health and behavioral disorders: Secondary | ICD-10-CM | POA: Diagnosis not present

## 2023-09-21 DIAGNOSIS — Z125 Encounter for screening for malignant neoplasm of prostate: Secondary | ICD-10-CM | POA: Diagnosis not present

## 2023-09-21 DIAGNOSIS — R7303 Prediabetes: Secondary | ICD-10-CM | POA: Diagnosis not present

## 2023-09-21 DIAGNOSIS — K219 Gastro-esophageal reflux disease without esophagitis: Secondary | ICD-10-CM | POA: Diagnosis not present

## 2023-09-21 DIAGNOSIS — Z9181 History of falling: Secondary | ICD-10-CM | POA: Diagnosis not present

## 2023-09-21 DIAGNOSIS — E7800 Pure hypercholesterolemia, unspecified: Secondary | ICD-10-CM | POA: Diagnosis not present

## 2023-09-21 DIAGNOSIS — Z79899 Other long term (current) drug therapy: Secondary | ICD-10-CM | POA: Diagnosis not present

## 2023-09-21 DIAGNOSIS — Z8546 Personal history of malignant neoplasm of prostate: Secondary | ICD-10-CM | POA: Diagnosis not present

## 2023-09-21 DIAGNOSIS — Z0000 Encounter for general adult medical examination without abnormal findings: Secondary | ICD-10-CM | POA: Diagnosis not present

## 2023-09-22 DIAGNOSIS — L820 Inflamed seborrheic keratosis: Secondary | ICD-10-CM | POA: Diagnosis not present

## 2023-09-22 DIAGNOSIS — L570 Actinic keratosis: Secondary | ICD-10-CM | POA: Diagnosis not present

## 2023-09-22 DIAGNOSIS — D2239 Melanocytic nevi of other parts of face: Secondary | ICD-10-CM | POA: Diagnosis not present

## 2023-10-09 DIAGNOSIS — Z23 Encounter for immunization: Secondary | ICD-10-CM | POA: Diagnosis not present

## 2023-10-13 DIAGNOSIS — H5203 Hypermetropia, bilateral: Secondary | ICD-10-CM | POA: Diagnosis not present

## 2023-10-13 DIAGNOSIS — H25813 Combined forms of age-related cataract, bilateral: Secondary | ICD-10-CM | POA: Diagnosis not present

## 2023-10-13 DIAGNOSIS — H524 Presbyopia: Secondary | ICD-10-CM | POA: Diagnosis not present

## 2024-01-22 DIAGNOSIS — K080 Exfoliation of teeth due to systemic causes: Secondary | ICD-10-CM | POA: Diagnosis not present

## 2024-02-01 DIAGNOSIS — J309 Allergic rhinitis, unspecified: Secondary | ICD-10-CM | POA: Diagnosis not present

## 2024-02-01 DIAGNOSIS — Z6830 Body mass index (BMI) 30.0-30.9, adult: Secondary | ICD-10-CM | POA: Diagnosis not present

## 2024-03-29 DIAGNOSIS — D2239 Melanocytic nevi of other parts of face: Secondary | ICD-10-CM | POA: Diagnosis not present

## 2024-03-29 DIAGNOSIS — L820 Inflamed seborrheic keratosis: Secondary | ICD-10-CM | POA: Diagnosis not present

## 2024-03-29 DIAGNOSIS — D485 Neoplasm of uncertain behavior of skin: Secondary | ICD-10-CM | POA: Diagnosis not present

## 2024-03-29 DIAGNOSIS — L578 Other skin changes due to chronic exposure to nonionizing radiation: Secondary | ICD-10-CM | POA: Diagnosis not present

## 2024-03-29 DIAGNOSIS — L570 Actinic keratosis: Secondary | ICD-10-CM | POA: Diagnosis not present

## 2024-03-29 DIAGNOSIS — D225 Melanocytic nevi of trunk: Secondary | ICD-10-CM | POA: Diagnosis not present

## 2024-04-03 DIAGNOSIS — C4361 Malignant melanoma of right upper limb, including shoulder: Secondary | ICD-10-CM | POA: Diagnosis not present

## 2024-04-13 DIAGNOSIS — C4361 Malignant melanoma of right upper limb, including shoulder: Secondary | ICD-10-CM | POA: Diagnosis not present

## 2024-05-07 DIAGNOSIS — G8929 Other chronic pain: Secondary | ICD-10-CM | POA: Diagnosis not present

## 2024-05-07 DIAGNOSIS — M25561 Pain in right knee: Secondary | ICD-10-CM | POA: Diagnosis not present

## 2024-05-29 DIAGNOSIS — K080 Exfoliation of teeth due to systemic causes: Secondary | ICD-10-CM | POA: Diagnosis not present

## 2024-06-01 DIAGNOSIS — D2239 Melanocytic nevi of other parts of face: Secondary | ICD-10-CM | POA: Diagnosis not present

## 2024-06-01 DIAGNOSIS — L570 Actinic keratosis: Secondary | ICD-10-CM | POA: Diagnosis not present

## 2024-06-01 DIAGNOSIS — D485 Neoplasm of uncertain behavior of skin: Secondary | ICD-10-CM | POA: Diagnosis not present

## 2024-06-01 DIAGNOSIS — Z85820 Personal history of malignant melanoma of skin: Secondary | ICD-10-CM | POA: Diagnosis not present

## 2024-06-01 DIAGNOSIS — D225 Melanocytic nevi of trunk: Secondary | ICD-10-CM | POA: Diagnosis not present

## 2024-06-14 DIAGNOSIS — C44719 Basal cell carcinoma of skin of left lower limb, including hip: Secondary | ICD-10-CM | POA: Diagnosis not present

## 2024-06-21 DIAGNOSIS — K080 Exfoliation of teeth due to systemic causes: Secondary | ICD-10-CM | POA: Diagnosis not present

## 2024-07-02 DIAGNOSIS — H2513 Age-related nuclear cataract, bilateral: Secondary | ICD-10-CM | POA: Diagnosis not present

## 2024-07-12 DIAGNOSIS — H2513 Age-related nuclear cataract, bilateral: Secondary | ICD-10-CM | POA: Diagnosis not present

## 2024-07-12 DIAGNOSIS — Z01818 Encounter for other preprocedural examination: Secondary | ICD-10-CM | POA: Diagnosis not present

## 2024-07-17 DIAGNOSIS — K080 Exfoliation of teeth due to systemic causes: Secondary | ICD-10-CM | POA: Diagnosis not present

## 2024-07-24 DIAGNOSIS — K219 Gastro-esophageal reflux disease without esophagitis: Secondary | ICD-10-CM | POA: Diagnosis not present

## 2024-07-24 DIAGNOSIS — E7800 Pure hypercholesterolemia, unspecified: Secondary | ICD-10-CM | POA: Diagnosis not present

## 2024-07-24 DIAGNOSIS — Z131 Encounter for screening for diabetes mellitus: Secondary | ICD-10-CM | POA: Diagnosis not present

## 2024-07-24 DIAGNOSIS — Z79899 Other long term (current) drug therapy: Secondary | ICD-10-CM | POA: Diagnosis not present

## 2024-07-24 DIAGNOSIS — G4733 Obstructive sleep apnea (adult) (pediatric): Secondary | ICD-10-CM | POA: Diagnosis not present

## 2024-07-24 DIAGNOSIS — Z8546 Personal history of malignant neoplasm of prostate: Secondary | ICD-10-CM | POA: Diagnosis not present

## 2024-07-30 DIAGNOSIS — I10 Essential (primary) hypertension: Secondary | ICD-10-CM | POA: Diagnosis not present

## 2024-07-30 DIAGNOSIS — M170 Bilateral primary osteoarthritis of knee: Secondary | ICD-10-CM | POA: Diagnosis not present

## 2024-07-30 DIAGNOSIS — K219 Gastro-esophageal reflux disease without esophagitis: Secondary | ICD-10-CM | POA: Diagnosis not present

## 2024-07-30 DIAGNOSIS — Z7982 Long term (current) use of aspirin: Secondary | ICD-10-CM | POA: Diagnosis not present

## 2024-07-30 DIAGNOSIS — H2511 Age-related nuclear cataract, right eye: Secondary | ICD-10-CM | POA: Diagnosis not present

## 2024-07-30 DIAGNOSIS — Z8546 Personal history of malignant neoplasm of prostate: Secondary | ICD-10-CM | POA: Diagnosis not present

## 2024-07-30 DIAGNOSIS — Z79899 Other long term (current) drug therapy: Secondary | ICD-10-CM | POA: Diagnosis not present

## 2024-07-30 DIAGNOSIS — H259 Unspecified age-related cataract: Secondary | ICD-10-CM | POA: Diagnosis not present

## 2024-08-13 DIAGNOSIS — Z79899 Other long term (current) drug therapy: Secondary | ICD-10-CM | POA: Diagnosis not present

## 2024-08-13 DIAGNOSIS — H259 Unspecified age-related cataract: Secondary | ICD-10-CM | POA: Diagnosis not present

## 2024-08-13 DIAGNOSIS — M170 Bilateral primary osteoarthritis of knee: Secondary | ICD-10-CM | POA: Diagnosis not present

## 2024-08-13 DIAGNOSIS — H2512 Age-related nuclear cataract, left eye: Secondary | ICD-10-CM | POA: Diagnosis not present

## 2024-08-13 DIAGNOSIS — G4733 Obstructive sleep apnea (adult) (pediatric): Secondary | ICD-10-CM | POA: Diagnosis not present

## 2024-08-13 DIAGNOSIS — K219 Gastro-esophageal reflux disease without esophagitis: Secondary | ICD-10-CM | POA: Diagnosis not present

## 2024-08-13 DIAGNOSIS — Z8546 Personal history of malignant neoplasm of prostate: Secondary | ICD-10-CM | POA: Diagnosis not present

## 2024-09-17 DIAGNOSIS — H524 Presbyopia: Secondary | ICD-10-CM | POA: Diagnosis not present

## 2024-09-18 DIAGNOSIS — Z23 Encounter for immunization: Secondary | ICD-10-CM | POA: Diagnosis not present

## 2024-09-27 DIAGNOSIS — D225 Melanocytic nevi of trunk: Secondary | ICD-10-CM | POA: Diagnosis not present

## 2024-09-27 DIAGNOSIS — Z85820 Personal history of malignant melanoma of skin: Secondary | ICD-10-CM | POA: Diagnosis not present

## 2024-09-27 DIAGNOSIS — L821 Other seborrheic keratosis: Secondary | ICD-10-CM | POA: Diagnosis not present

## 2024-09-27 DIAGNOSIS — L570 Actinic keratosis: Secondary | ICD-10-CM | POA: Diagnosis not present

## 2024-09-27 DIAGNOSIS — D2239 Melanocytic nevi of other parts of face: Secondary | ICD-10-CM | POA: Diagnosis not present

## 2024-10-07 DIAGNOSIS — Z961 Presence of intraocular lens: Secondary | ICD-10-CM | POA: Diagnosis not present

## 2024-10-24 DIAGNOSIS — K080 Exfoliation of teeth due to systemic causes: Secondary | ICD-10-CM | POA: Diagnosis not present

## 2024-10-28 DIAGNOSIS — G8929 Other chronic pain: Secondary | ICD-10-CM | POA: Diagnosis not present

## 2024-10-28 DIAGNOSIS — M25561 Pain in right knee: Secondary | ICD-10-CM | POA: Diagnosis not present
